# Patient Record
Sex: Male | Born: 1980 | Race: Black or African American | Hispanic: No | Marital: Single | Smoking: Current every day smoker
Health system: Southern US, Community
[De-identification: ages and names within clinical notes are randomized; demographics above are authoritative.]

## PROBLEM LIST (undated history)

## (undated) DIAGNOSIS — K6289 Other specified diseases of anus and rectum: Secondary | ICD-10-CM

## (undated) DIAGNOSIS — K602 Anal fissure, unspecified: Secondary | ICD-10-CM

## (undated) DIAGNOSIS — Z87828 Personal history of other (healed) physical injury and trauma: Secondary | ICD-10-CM

## (undated) HISTORY — PX: NO PAST SURGERIES: SHX2092

---
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
## 2001-02-20 ENCOUNTER — Emergency Department (HOSPITAL_COMMUNITY): Admission: EM | Admit: 2001-02-20 | Discharge: 2001-02-20 | Payer: Self-pay | Admitting: Emergency Medicine

## 2008-10-01 ENCOUNTER — Emergency Department (HOSPITAL_COMMUNITY): Admission: EM | Admit: 2008-10-01 | Discharge: 2008-10-02 | Payer: Self-pay | Admitting: Emergency Medicine

## 2009-01-03 ENCOUNTER — Emergency Department (HOSPITAL_COMMUNITY): Admission: EM | Admit: 2009-01-03 | Discharge: 2009-01-03 | Payer: Self-pay | Admitting: Emergency Medicine

## 2009-05-20 ENCOUNTER — Emergency Department (HOSPITAL_COMMUNITY): Admission: EM | Admit: 2009-05-20 | Discharge: 2009-05-20 | Payer: Self-pay | Admitting: Family Medicine

## 2009-05-30 ENCOUNTER — Emergency Department (HOSPITAL_COMMUNITY): Admission: AC | Admit: 2009-05-30 | Discharge: 2009-05-30 | Payer: Self-pay

## 2009-07-16 ENCOUNTER — Emergency Department (HOSPITAL_COMMUNITY): Admission: EM | Admit: 2009-07-16 | Discharge: 2009-07-16 | Payer: Self-pay | Admitting: Emergency Medicine

## 2009-08-26 ENCOUNTER — Emergency Department (HOSPITAL_COMMUNITY): Admission: EM | Admit: 2009-08-26 | Discharge: 2009-08-26 | Payer: Self-pay | Admitting: Family Medicine

## 2009-12-31 ENCOUNTER — Emergency Department (HOSPITAL_COMMUNITY): Admission: EM | Admit: 2009-12-31 | Discharge: 2009-12-31 | Payer: Self-pay | Admitting: Family Medicine

## 2011-03-09 LAB — GC/CHLAMYDIA PROBE AMP, GENITAL: GC Probe Amp, Genital: NEGATIVE

## 2011-03-09 LAB — RPR: RPR Ser Ql: NONREACTIVE

## 2011-03-10 LAB — GC/CHLAMYDIA PROBE AMP, GENITAL: Chlamydia, DNA Probe: NEGATIVE

## 2011-03-12 LAB — TYPE AND SCREEN: Antibody Screen: NEGATIVE

## 2011-03-12 LAB — GC/CHLAMYDIA PROBE AMP, GENITAL: Chlamydia, DNA Probe: POSITIVE — AB

## 2011-03-12 LAB — CBC
Hemoglobin: 14.8 g/dL (ref 13.0–17.0)
MCHC: 34.7 g/dL (ref 30.0–36.0)
Platelets: 315 10*3/uL (ref 150–400)
RBC: 4.49 MIL/uL (ref 4.22–5.81)

## 2011-03-12 LAB — LACTIC ACID, PLASMA: Lactic Acid, Venous: 2.5 mmol/L — ABNORMAL HIGH (ref 0.5–2.2)

## 2011-03-12 LAB — PROTIME-INR: Prothrombin Time: 12.6 seconds (ref 11.6–15.2)

## 2011-03-20 LAB — GC/CHLAMYDIA PROBE AMP, GENITAL
Chlamydia, DNA Probe: NEGATIVE
GC Probe Amp, Genital: POSITIVE — AB
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
## 2011-11-03 ENCOUNTER — Emergency Department (HOSPITAL_COMMUNITY)
Admission: EM | Admit: 2011-11-03 | Discharge: 2011-11-03 | Disposition: A | Discharge status: Home / Self Care | Payer: Self-pay | Source: Home / Self Care

## 2011-11-03 DIAGNOSIS — Z202 Contact with and (suspected) exposure to infections with a predominantly sexual mode of transmission: Secondary | ICD-10-CM

## 2011-11-03 MED ORDER — METRONIDAZOLE 500 MG PO TABS: 500.0000 mg | ORAL_TABLET | Freq: Two times a day (BID) | ORAL | Status: AC

## 2011-11-03 NOTE — ED Provider Notes (Signed)
History     CSN: [id number] Arrival date & time: 11/03/2011  9:08 PM   None     Chief Complaint  Patient presents with  . SEXUALLY TRANSMITTED DISEASE    Steven Irwin was told today he was exposed to trich and denies any symptoms    (Consider location/radiation/quality/duration/timing/severity/associated sxs/prior treatment) HPI Comments: Steven Irwin states he was told today that he was exposed to trichamonas by a recent sexual partner. He denies penile discharge, lesions, or dysuria. He is requesting STD testing and treatment.   The history is provided by the patient.    History reviewed. No pertinent past medical history.  History reviewed. No pertinent past surgical history.  History reviewed. No pertinent family history.  History  Substance Use Topics  . Smoking status: Current Everyday Smoker -- 1.0 packs/day    Types: Cigarettes  . Smokeless tobacco: Not on file  . Alcohol Use: Yes      Review of Systems  Gastrointestinal: Negative for abdominal pain.  Genitourinary: Negative for dysuria, frequency, discharge and genital sores.    Allergies  Review of patient's allergies indicates no known allergies.  Home Medications   Current Outpatient Rx  Name Route Sig Dispense Refill  . METRONIDAZOLE 500 MG PO TABS Oral Take 1 tablet (500 mg total) by mouth 2 (two) times daily. 14 tablet 0    BP 132/86  Pulse 93  Temp(Src) 98.4 F (36.9 C) (Oral)  Resp 17  SpO2 97%  Physical Exam  Nursing note and vitals reviewed. Constitutional: He is oriented to person, place, and time.  Cardiovascular: Normal rate, regular rhythm and normal heart sounds.   Pulmonary/Chest: Effort normal and breath sounds normal. No respiratory distress.  Genitourinary: Testes normal and penis normal. Circumcised.  Lymphadenopathy:       Right: No inguinal adenopathy present.       Left: No inguinal adenopathy present.  Neurological: He is alert and oriented to person, place, and time.  Skin: Skin is  warm and dry.  Psychiatric: He has a normal mood and affect.    ED Course  Procedures (including critical care time)   Labs Reviewed  GC/CHLAMYDIA PROBE AMP, GENITAL   No results found.   1. Exposure to STD       MDM          Melody Comas, PA 11/03/11 2151

## 2011-11-03 NOTE — ED Provider Notes (Signed)
Medical screening examination/treatment/procedure(s) were performed by non-physician practitioner and as supervising physician I was immediately available for consultation/collaboration.  LANEY,RONNIE   Ronnie Laney, MD 11/03/11 2223 

## 2011-11-05 LAB — GC/CHLAMYDIA PROBE AMP, GENITAL: Chlamydia, DNA Probe: NEGATIVE

## 2014-01-12 ENCOUNTER — Emergency Department (INDEPENDENT_AMBULATORY_CARE_PROVIDER_SITE_OTHER)
Admission: EM | Admit: 2014-01-12 | Discharge: 2014-01-12 | Disposition: A | Discharge status: Home / Self Care | Payer: Self-pay | Source: Home / Self Care | Attending: Family Medicine | Admitting: Family Medicine

## 2014-01-12 ENCOUNTER — Encounter (HOSPITAL_COMMUNITY): Payer: Self-pay | Admitting: Emergency Medicine

## 2014-01-12 DIAGNOSIS — Z113 Encounter for screening for infections with a predominantly sexual mode of transmission: Secondary | ICD-10-CM | POA: Insufficient documentation

## 2014-01-12 DIAGNOSIS — R369 Urethral discharge, unspecified: Secondary | ICD-10-CM

## 2014-01-12 DIAGNOSIS — Z202 Contact with and (suspected) exposure to infections with a predominantly sexual mode of transmission: Secondary | ICD-10-CM

## 2014-01-12 LAB — HIV ANTIBODY (ROUTINE TESTING W REFLEX): HIV: NONREACTIVE

## 2014-01-12 LAB — RPR: RPR: NONREACTIVE

## 2014-01-12 MED ORDER — AZITHROMYCIN 250 MG PO TABS
ORAL_TABLET | ORAL | Status: AC
  Filled 2014-01-12: qty 4

## 2014-01-12 MED ORDER — LIDOCAINE HCL (PF) 1 % IJ SOLN
INTRAMUSCULAR | Status: AC
  Filled 2014-01-12: qty 5

## 2014-01-12 MED ORDER — CEFTRIAXONE SODIUM 250 MG IJ SOLR
INTRAMUSCULAR | Status: AC
  Filled 2014-01-12: qty 250

## 2014-01-12 MED ORDER — AZITHROMYCIN 250 MG PO TABS
1000.0000 mg | ORAL_TABLET | Freq: Every day | ORAL | Status: DC
  Administered 2014-01-12: 1000 mg via ORAL

## 2014-01-12 MED ORDER — CEFTRIAXONE SODIUM 250 MG IJ SOLR
250.0000 mg | Freq: Once | INTRAMUSCULAR | Status: AC
  Administered 2014-01-12: 250 mg via INTRAMUSCULAR

## 2014-01-12 MED ORDER — METRONIDAZOLE 500 MG PO TABS: 1000.0000 mg | ORAL_TABLET | Freq: Two times a day (BID) | ORAL | Status: DC

## 2014-01-12 NOTE — ED Provider Notes (Signed)
CSN: [id number]     Arrival date & time 01/12/14  1241 History   First MD Initiated Contact with Patient 01/12/14 1345     Chief Complaint  Patient presents with  . Exposure to STD     (Consider location/radiation/quality/duration/timing/severity/associated sxs/prior Treatment) HPI Comments: 33 year old male presents complaining of STD exposure and penile discharge. He was told by a sexual partner that she has Trichomonas and Chlamydia and that he should be tested. He has noticed some very mild urethral irritation and very mild discharge. He denies testicular pain, abdominal pain, dysuria. He has no history of STDs. No penile lesions. No other systemic symptoms  Patient is a 33 y.o. male presenting with STD exposure.  Exposure to STD Pertinent negatives include no chest pain, no abdominal pain and no shortness of breath.    History reviewed. No pertinent past medical history. History reviewed. No pertinent past surgical history. History reviewed. No pertinent family history. History  Substance Use Topics  . Smoking status: Current Every Day Smoker -- 1.00 packs/day    Types: Cigarettes  . Smokeless tobacco: Not on file  . Alcohol Use: Yes    Review of Systems  Constitutional: Negative for fever, chills and fatigue.  HENT: Negative for sore throat.   Eyes: Negative for visual disturbance.  Respiratory: Negative for cough and shortness of breath.   Cardiovascular: Negative for chest pain, palpitations and leg swelling.  Gastrointestinal: Negative for nausea, vomiting, abdominal pain, diarrhea and constipation.  Genitourinary: Positive for discharge. Negative for dysuria, urgency, frequency, hematuria and genital sores.  Musculoskeletal: Negative for arthralgias, myalgias, neck pain and neck stiffness.  Skin: Negative for rash.  Neurological: Negative for dizziness, weakness and light-headedness.      Allergies  Review of patient's allergies indicates no known  allergies.  Home Medications   Current Outpatient Rx  Name  Route  Sig  Dispense  Refill  . metroNIDAZOLE (FLAGYL) 500 MG tablet   Oral   Take 2 tablets (1,000 mg total) by mouth 2 (two) times daily.   4 tablet   0    BP 128/87  Pulse 78  Temp(Src) 97.9 F (36.6 C) (Oral)  Resp 16  SpO2 99% Physical Exam  Nursing note and vitals reviewed. Constitutional: He is oriented to person, place, and time. He appears well-developed and well-nourished. No distress.  HENT:  Head: Normocephalic.  Pulmonary/Chest: Effort normal. No respiratory distress.  Abdominal: There is no tenderness.  Genitourinary: Testes normal and penis normal. Right testis shows no mass, no swelling and no tenderness. Left testis shows no mass, no swelling and no tenderness. No penile erythema or penile tenderness. No discharge found.  Lymphadenopathy:       Right: No inguinal adenopathy present.       Left: No inguinal adenopathy present.  Neurological: He is alert and oriented to person, place, and time. Coordination normal.  Skin: Skin is warm and dry. No rash noted. He is not diaphoretic.  Psychiatric: He has a normal mood and affect. Judgment normal.    ED Course  Procedures (including critical care time) Labs Review Labs Reviewed  RPR  HIV ANTIBODY (ROUTINE TESTING)  URINE CYTOLOGY ANCILLARY ONLY   Imaging Review No results found.    MDM   Final diagnoses:  Exposure to STD  Penile discharge    Treating for STDs given exposure. Urine cytology sent, HIV and RPR sent as well. Followup when necessary   Meds ordered this encounter  Medications  . cefTRIAXone (ROCEPHIN) injection  250 mg    Sig:   . azithromycin (ZITHROMAX) tablet 1,000 mg    Sig:   . metroNIDAZOLE (FLAGYL) 500 MG tablet    Sig: Take 2 tablets (1,000 mg total) by mouth 2 (two) times daily.    Dispense:  4 tablet    Refill:  0    Order Specific Question:  Supervising Provider    Answer:  Bradd CanaryKINDL, JAMES D [[id number]]        Steven GoodZachary H Greydis Stlouis, PA-C 01/12/14 [phone]

## 2014-01-12 NOTE — Discharge Instructions (Signed)
Sexually Transmitted Disease A sexually transmitted disease (STD) is a disease or infection that may be passed (transmitted) from person to person, usually during sexual activity. This may happen by way of saliva, semen, blood, vaginal mucus, or urine. Common STDs include:   Gonorrhea.   Chlamydia.   Syphilis.   HIV and AIDS.   Genital herpes.   Hepatitis B and C.   Trichomonas.   Human papillomavirus (HPV).   Pubic lice.   Scabies.  Mites.  Bacterial vaginosis. WHAT ARE CAUSES OF STDs? An STD may be caused by bacteria, a virus, or parasites. STDs are often transmitted during sexual activity if one person is infected. However, they may also be transmitted through nonsexual means. STDs may be transmitted after:   Sexual intercourse with an infected person.   Sharing sex toys with an infected person.   Sharing needles with an infected person or using unclean piercing or tattoo needles.  Having intimate contact with the genitals, mouth, or rectal areas of an infected person.   Exposure to infected fluids during birth. WHAT ARE THE SIGNS AND SYMPTOMS OF STDs? Different STDs have different symptoms. Some people may not have any symptoms. If symptoms are present, they may include:   Painful or bloody urination.   Pain in the pelvis, abdomen, vagina, anus, throat, or eyes.   Skin rash, itching, irritation, growths, sores (lesions), ulcerations, or warts in the genital or anal area.  Abnormal vaginal discharge with or without bad odor.   Penile discharge in men.   Fever.   Pain or bleeding during sexual intercourse.   Swollen glands in the groin area.   Yellow skin and eyes (jaundice). This is seen with hepatitis.   Swollen testicles.  Infertility.  Sores and blisters in the mouth. HOW ARE STDs DIAGNOSED? To make a diagnosis, your health care provider may:   Take a medical history.   Perform a physical exam.   Take a sample of any  discharge for examination.  Swab the throat, cervix, opening to the penis, rectum, or vagina for testing.  Test a sample of your first morning urine.   Perform blood tests.   Perform a Pap smear, if this applies.   Perform a colposcopy.   Perform a laparoscopy.  HOW ARE STDs TREATED? Treatment depends on the STD. Some STDs may be treated but not cured.   Chlamydia, gonorrhea, trichomonas, and syphilis can be cured with antibiotics.   Genital herpes, hepatitis, and HIV can be treated, but not cured, with prescribed medicines. The medicines lessen symptoms.   Genital warts from HPV can be treated with medicine or by freezing, burning (electrocautery), or surgery. Warts may come back.   HPV cannot be cured with medicine or surgery. However, abnormal areas may be removed from the cervix, vagina, or vulva.   If your diagnosis is confirmed, your recent sexual partners need treatment. This is true even if they are symptom-free or have a negative culture or evaluation. They should not have sex until their health care providers say it is OK. HOW CAN I REDUCE MY RISK OF GETTING AN STD?  Use latex condoms, dental dams, and water-soluble lubricants during sexual activity. Do not use petroleum jelly or oils.  Get vaccinated for HPV and hepatitis. If you have not received these vaccines in the past, talk to your health care provider about whether one or both might be right for you.   Avoid risky sex practices that can break the skin.  WHAT SHOULD   I DO IF I THINK I HAVE AN STD?  See your health care provider.   Inform all sexual partners. They should be tested and treated for any STDs.  Do not have sex until your health care provider says it is OK. WHEN SHOULD I GET HELP? Seek immediate medical care if:  You develop severe abdominal pain.  You are a man and notice swelling or pain in the testicles.  You are a woman and notice swelling or pain in your vagina. Document  Released: 02/09/2003 Document Revised: 09/09/2013 Document Reviewed: 06/09/2013 [employer] Patient Information 2014 [employer].  

## 2014-01-12 NOTE — ED Notes (Signed)
Reports having penile discharge for two days.  Pt was also informed by partner that she tested positive for chlamydia and trich.

## 2014-01-13 NOTE — ED Provider Notes (Signed)
Medical screening examination/treatment/procedure(s) were performed by resident physician or non-physician practitioner and as supervising physician I was immediately available for consultation/collaboration.   Lannah Koike DOUGLAS MD.   Raiden Haydu D Chong Wojdyla, MD 01/13/14 1950 

## 2014-01-14 ENCOUNTER — Other Ambulatory Visit (HOSPITAL_COMMUNITY)
Admission: RE | Admit: 2014-01-14 | Discharge: 2014-01-14 | Disposition: A | Discharge status: Home / Self Care | Payer: Self-pay | Source: Ambulatory Visit | Attending: Family Medicine | Admitting: Family Medicine

## 2014-01-14 NOTE — ED Notes (Signed)
GC and Trich neg., Chlamydia pos., HIV/RPR non-reactive.  Pt. Adequately treated with Zithromax and Rocephin. Will notify pt. Steven Irwin, [name] M 01/14/2014

## 2014-01-15 ENCOUNTER — Telehealth (HOSPITAL_COMMUNITY): Payer: Self-pay | Admitting: *Deleted
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
## 2014-01-15 NOTE — ED Notes (Addendum)
I called and left a message to call. Call 1. Steven Irwin, Steven Irwin 01/15/2014 Pt. called back. Pt. verified x 2 and given results.  Pt. Told he was adequately treated for Chlamydia with Zithromax.  Pt. instructed to notify his partner, no sex for 1 week and to practice safe sex. Pt. told [facility] by appointment in 6 mos. 01/15/2014

## 2014-01-17 NOTE — ED Notes (Signed)
2/12 DHHS [facility]. Cherly AndersonYork, Jamiee Milholland M

## 2014-06-28 ENCOUNTER — Emergency Department (HOSPITAL_COMMUNITY)
Admission: EM | Admit: 2014-06-28 | Discharge: 2014-06-28 | Disposition: A | Discharge status: Home / Self Care | Payer: Self-pay | Attending: Emergency Medicine | Admitting: Emergency Medicine

## 2014-06-28 ENCOUNTER — Emergency Department (HOSPITAL_COMMUNITY): Payer: Self-pay

## 2014-06-28 ENCOUNTER — Encounter (HOSPITAL_COMMUNITY): Payer: Self-pay | Admitting: Emergency Medicine

## 2014-06-28 ENCOUNTER — Emergency Department (HOSPITAL_COMMUNITY): Admission: EM | Admit: 2014-06-28 | Discharge: 2014-06-28 | Discharge status: Absent without leave | Payer: Self-pay

## 2014-06-28 DIAGNOSIS — F17200 Nicotine dependence, unspecified, uncomplicated: Secondary | ICD-10-CM | POA: Insufficient documentation

## 2014-06-28 DIAGNOSIS — Z23 Encounter for immunization: Secondary | ICD-10-CM | POA: Insufficient documentation

## 2014-06-28 DIAGNOSIS — Y929 Unspecified place or not applicable: Secondary | ICD-10-CM | POA: Insufficient documentation

## 2014-06-28 DIAGNOSIS — S3120XA Unspecified open wound of penis, initial encounter: Secondary | ICD-10-CM | POA: Insufficient documentation

## 2014-06-28 DIAGNOSIS — Y939 Activity, unspecified: Secondary | ICD-10-CM | POA: Insufficient documentation

## 2014-06-28 DIAGNOSIS — X58XXXA Exposure to other specified factors, initial encounter: Secondary | ICD-10-CM | POA: Insufficient documentation

## 2014-06-28 DIAGNOSIS — IMO0002 Reserved for concepts with insufficient information to code with codable children: Secondary | ICD-10-CM | POA: Insufficient documentation

## 2014-06-28 LAB — CBC WITH DIFFERENTIAL/PLATELET
BASOS ABS: 0 10*3/uL (ref 0.0–0.1)
Basophils Relative: 0 % (ref 0–1)
EOS ABS: 0.3 10*3/uL (ref 0.0–0.7)
EOS PCT: 3 % (ref 0–5)
HCT: 39.3 % (ref 39.0–52.0)
Hemoglobin: 13.6 g/dL (ref 13.0–17.0)
Lymphocytes Relative: 19 % (ref 12–46)
Lymphs Abs: 1.7 10*3/uL (ref 0.7–4.0)
MCH: 32.5 pg (ref 26.0–34.0)
MCHC: 34.6 g/dL (ref 30.0–36.0)
MCV: 94 fL (ref 78.0–100.0)
Monocytes Absolute: 1 10*3/uL (ref 0.1–1.0)
Monocytes Relative: 11 % (ref 3–12)
Neutro Abs: 5.9 10*3/uL (ref 1.7–7.7)
Neutrophils Relative %: 67 % (ref 43–77)
Platelets: 315 10*3/uL (ref 150–400)
RBC: 4.18 MIL/uL — ABNORMAL LOW (ref 4.22–5.81)
RDW: 12.2 % (ref 11.5–15.5)
WBC: 8.8 10*3/uL (ref 4.0–10.5)

## 2014-06-28 LAB — I-STAT CHEM 8, ED
BUN: 13 mg/dL (ref 6–23)
CREATININE: 1.2 mg/dL (ref 0.50–1.35)
Calcium, Ion: 1.2 mmol/L (ref 1.12–1.23)
Chloride: 101 mEq/L (ref 96–112)
Glucose, Bld: 124 mg/dL — ABNORMAL HIGH (ref 70–99)
HCT: 43 % (ref 39.0–52.0)
HEMOGLOBIN: 14.6 g/dL (ref 13.0–17.0)
Potassium: 3.6 mEq/L — ABNORMAL LOW (ref 3.7–5.3)
Sodium: 141 mEq/L (ref 137–147)
TCO2: 30 mmol/L (ref 0–100)

## 2014-06-28 LAB — URINALYSIS, ROUTINE W REFLEX MICROSCOPIC
Bilirubin Urine: NEGATIVE
Glucose, UA: NEGATIVE mg/dL
HGB URINE DIPSTICK: NEGATIVE
Ketones, ur: 15 mg/dL — AB
Leukocytes, UA: NEGATIVE
Nitrite: NEGATIVE
PROTEIN: NEGATIVE mg/dL
Specific Gravity, Urine: 1.005 (ref 1.005–1.030)
UROBILINOGEN UA: 0.2 mg/dL (ref 0.0–1.0)
pH: 6.5 (ref 5.0–8.0)

## 2014-06-28 MED ORDER — CEPHALEXIN 500 MG PO CAPS: 500.0000 mg | ORAL_CAPSULE | Freq: Four times a day (QID) | ORAL | Status: DC

## 2014-06-28 MED ORDER — IOHEXOL 300 MG/ML  SOLN
100.0000 mL | Freq: Once | INTRAMUSCULAR | Status: AC | PRN
  Administered 2014-06-28: 100 mL via INTRAVENOUS

## 2014-06-28 MED ORDER — HYDROCODONE-ACETAMINOPHEN 5-325 MG PO TABS: 1.0000 | ORAL_TABLET | ORAL | Status: DC | PRN

## 2014-06-28 MED ORDER — CEFAZOLIN SODIUM 1-5 GM-% IV SOLN
1.0000 g | Freq: Once | INTRAVENOUS | Status: AC
  Administered 2014-06-28: 1 g via INTRAVENOUS
  Filled 2014-06-28: qty 50

## 2014-06-28 MED ORDER — TETANUS-DIPHTH-ACELL PERTUSSIS 5-2.5-18.5 LF-MCG/0.5 IM SUSP
0.5000 mL | Freq: Once | INTRAMUSCULAR | Status: AC
  Administered 2014-06-28: 0.5 mL via INTRAMUSCULAR
  Filled 2014-06-28: qty 0.5

## 2014-06-28 MED ORDER — MORPHINE SULFATE 4 MG/ML IJ SOLN
4.0000 mg | Freq: Once | INTRAMUSCULAR | Status: AC
  Administered 2014-06-28: 4 mg via INTRAVENOUS
  Filled 2014-06-28: qty 1

## 2014-06-28 NOTE — ED Notes (Signed)
Pt to radiology at this time.
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
## 2014-06-28 NOTE — ED Notes (Signed)
Pt instructed to not have food/drink until scans are resulted. Verbalized understanding.
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
## 2014-06-28 NOTE — ED Notes (Signed)
Awaiting urinalysis results prior to pt being discharged.
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
## 2014-09-18 ENCOUNTER — Encounter (HOSPITAL_COMMUNITY): Payer: Self-pay | Admitting: Emergency Medicine

## 2014-09-18 ENCOUNTER — Emergency Department (HOSPITAL_COMMUNITY)
Admission: EM | Admit: 2014-09-18 | Discharge: 2014-09-18 | Disposition: A | Discharge status: Home / Self Care | Payer: Self-pay | Attending: Emergency Medicine | Admitting: Emergency Medicine

## 2014-09-18 DIAGNOSIS — S161XXA Strain of muscle, fascia and tendon at neck level, initial encounter: Secondary | ICD-10-CM | POA: Insufficient documentation

## 2014-09-18 DIAGNOSIS — Y9389 Activity, other specified: Secondary | ICD-10-CM | POA: Insufficient documentation

## 2014-09-18 DIAGNOSIS — S29001A Unspecified injury of muscle and tendon of front wall of thorax, initial encounter: Secondary | ICD-10-CM | POA: Insufficient documentation

## 2014-09-18 DIAGNOSIS — S0990XA Unspecified injury of head, initial encounter: Secondary | ICD-10-CM | POA: Insufficient documentation

## 2014-09-18 DIAGNOSIS — T148XXA Other injury of unspecified body region, initial encounter: Secondary | ICD-10-CM

## 2014-09-18 DIAGNOSIS — Y92410 Unspecified street and highway as the place of occurrence of the external cause: Secondary | ICD-10-CM | POA: Insufficient documentation

## 2014-09-18 DIAGNOSIS — Z720 Tobacco use: Secondary | ICD-10-CM | POA: Insufficient documentation

## 2014-09-18 DIAGNOSIS — R0789 Other chest pain: Secondary | ICD-10-CM

## 2014-09-18 DIAGNOSIS — S20309A Unspecified superficial injuries of unspecified front wall of thorax, initial encounter: Secondary | ICD-10-CM | POA: Insufficient documentation

## 2014-09-18 MED ORDER — CYCLOBENZAPRINE HCL 10 MG PO TABS: 10.0000 mg | ORAL_TABLET | Freq: Two times a day (BID) | ORAL | Status: DC | PRN

## 2014-09-18 MED ORDER — NAPROXEN 500 MG PO TABS: 500.0000 mg | ORAL_TABLET | Freq: Two times a day (BID) | ORAL | Status: DC
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
## 2014-09-23 NOTE — ED Provider Notes (Signed)
Medical screening examination/treatment/procedure(s) were performed by non-physician practitioner and as supervising physician I was immediately available for consultation/collaboration.   EKG Interpretation None       Arianie Couse L Shahd Occhipinti, MD 09/23/14 2057 

## 2015-05-23 ENCOUNTER — Emergency Department (HOSPITAL_COMMUNITY)
Admission: EM | Admit: 2015-05-23 | Discharge: 2015-05-23 | Disposition: A | Discharge status: Home / Self Care | Payer: Self-pay | Attending: Emergency Medicine | Admitting: Emergency Medicine

## 2015-05-23 ENCOUNTER — Encounter (HOSPITAL_COMMUNITY): Payer: Self-pay | Admitting: Family Medicine

## 2015-05-23 DIAGNOSIS — Z720 Tobacco use: Secondary | ICD-10-CM | POA: Insufficient documentation

## 2015-05-23 DIAGNOSIS — K625 Hemorrhage of anus and rectum: Secondary | ICD-10-CM | POA: Insufficient documentation

## 2015-05-23 LAB — CBC
HCT: 44.6 % (ref 39.0–52.0)
Hemoglobin: 15.8 g/dL (ref 13.0–17.0)
MCH: 32 pg (ref 26.0–34.0)
MCHC: 35.4 g/dL (ref 30.0–36.0)
MCV: 90.5 fL (ref 78.0–100.0)
PLATELETS: 327 10*3/uL (ref 150–400)
RBC: 4.93 MIL/uL (ref 4.22–5.81)
RDW: 12.8 % (ref 11.5–15.5)
WBC: 6.2 10*3/uL (ref 4.0–10.5)

## 2015-05-23 LAB — COMPREHENSIVE METABOLIC PANEL
ALBUMIN: 3.9 g/dL (ref 3.5–5.0)
ALK PHOS: 93 U/L (ref 38–126)
ALT: 31 U/L (ref 17–63)
AST: 29 U/L (ref 15–41)
Anion gap: 9 (ref 5–15)
BUN: 13 mg/dL (ref 6–20)
CO2: 26 mmol/L (ref 22–32)
Calcium: 9.7 mg/dL (ref 8.9–10.3)
Chloride: 104 mmol/L (ref 101–111)
Creatinine, Ser: 1.32 mg/dL — ABNORMAL HIGH (ref 0.61–1.24)
GFR calc Af Amer: >60 mL/min (ref >60)
Glucose, Bld: 108 mg/dL — ABNORMAL HIGH (ref 65–99)
POTASSIUM: 4.1 mmol/L (ref 3.5–5.1)
SODIUM: 139 mmol/L (ref 135–145)
Total Bilirubin: 0.9 mg/dL (ref 0.3–1.2)
Total Protein: 6.9 g/dL (ref 6.5–8.1)

## 2015-05-23 LAB — POC OCCULT BLOOD, ED: Fecal Occult Bld: POSITIVE — AB

## 2015-05-23 MED ORDER — DOCUSATE SODIUM 100 MG PO CAPS: 100.0000 mg | ORAL_CAPSULE | Freq: Two times a day (BID) | ORAL | Status: DC

## 2015-05-23 NOTE — ED Notes (Signed)
Pt here for rectal bleeding. sts x 3 days and blood in the toilet. Denies pain. sts bright red. Pt has a picture.

## 2015-05-23 NOTE — Discharge Instructions (Signed)
1. Medications: colace, usual home medications 2. Treatment: rest, drink plenty of fluids, increase the fiber in your diet 3. Follow Up: Please followup with the [facility] at your appointment on Wednesday and they will refer you to GI.     Bloody Stools Bloody stools often mean that there is a problem in the digestive tract. Your caregiver may use the term "melena" to describe black, tarry, and bad smelling stools or "hematochezia" to describe red or maroon-colored stools. Blood seen in the stool can be caused by bleeding anywhere along the intestinal tract.  A black stool usually means that blood is coming from the upper part of the gastrointestinal tract (esophagus, stomach, or small bowel). Passing maroon-colored stools or bright red blood usually means that blood is coming from lower down in the large bowel or the rectum. However, sometimes massive bleeding in the stomach or small intestine can cause bright red bloody stools.  Consuming black licorice, lead, iron pills, medicines containing bismuth subsalicylate, or blueberries can also cause black stools. Your caregiver can test black stools to see if blood is present. It is important that the cause of the bleeding be found. Treatment can then be started, and the problem can be corrected. Rectal bleeding may not be serious, but you should not assume everything is okay until you know the cause.It is very important to follow up with your caregiver or a specialist in gastrointestinal problems. CAUSES  Blood in the stools can come from various underlying causes.Often, the cause is not found during your first visit. Testing is often needed to discover the cause of bleeding in the gastrointestinal tract. Causes range from simple to serious or even life-threatening.Possible causes include:  Hemorrhoids.These are veins that are full of blood (engorged) in the rectum. They cause pain, inflammation, and may bleed.  Anal fissures.These are  areas of painful tearing which may bleed. They are often caused by passing hard stool.  Diverticulosis.These are pouches that form on the colon over time, with age, and may bleed significantly.  Diverticulitis.This is inflammation in areas with diverticulosis. It can cause pain, fever, and bloody stools, although bleeding is rare.  Proctitis and colitis. These are inflamed areas of the rectum or colon. They may cause pain, fever, and bloody stools.  Polyps and cancer. Colon cancer is a leading cause of preventable cancer death.It often starts out as precancerous polyps that can be removed during a colonoscopy, preventing progression into cancer. Sometimes, polyps and cancer may cause rectal bleeding.  Gastritis and ulcers.Bleeding from the upper gastrointestinal tract (near the stomach) may travel through the intestines and produce black, sometimes tarry, often bad smelling stools. In certain cases, if the bleeding is fast enough, the stools may not be black, but red and the condition may be life-threatening. SYMPTOMS  You may have stools that are bright red and bloody, that are normal color with blood on them, or that are dark black and tarry. In some cases, you may only have blood in the toilet bowl. Any of these cases need medical care. You may also have:  Pain at the anus or anywhere in the rectum.  Lightheadedness or feeling faint.  Extreme weakness.  Nausea or vomiting.  Fever. DIAGNOSIS Your caregiver may use the following methods to find the cause of your bleeding:  Taking a medical history. Age is important. Older people tend to develop polyps and cancer more often. If there is anal pain and a hard, large stool associated with bleeding, a tear  of the anus may be the cause. If blood drips into the toilet after a bowel movement, bleeding hemorrhoids may be the problem. The color and frequency of the bleeding are additional considerations. In most cases, the medical history  provides clues, but seldom the final answer.  A visual and finger (digital) exam. Your caregiver will inspect the anal area, looking for tears and hemorrhoids. A finger exam can provide information when there is tenderness or a growth inside. In men, the prostate is also examined.  Endoscopy. Several types of small, long scopes (endoscopes) are used to view the colon.  In the office, your caregiver may use a rigid, or more commonly, a flexible viewing sigmoidoscope. This exam is called flexible sigmoidoscopy. It is performed in 5 to 10 minutes.  A more thorough exam is accomplished with a colonoscope. It allows your caregiver to view the entire 5 to 6 foot long colon. Medicine to help you relax (sedative) is usually given for this exam. Frequently, a bleeding lesion may be present beyond the reach of the sigmoidoscope. So, a colonoscopy may be the best exam to start with. Both exams are usually done on an outpatient basis. This means the patient does not stay overnight in the hospital or surgery center.  An upper endoscopy may be needed to examine your stomach. Sedation is used and a flexible endoscope is put in your mouth, down to your stomach.  A barium enema X-ray. This is an X-ray exam. It uses liquid barium inserted by enema into the rectum. This test alone may not identify an actual bleeding point. X-rays highlight abnormal shadows, such as those made by lumps (tumors), diverticuli, or colitis. TREATMENT  Treatment depends on the cause of your bleeding.   For bleeding from the stomach or colon, the caregiver doing your endoscopy or colonoscopy may be able to stop the bleeding as part of the procedure.  Inflammation or infection of the colon can be treated with medicines.  Many rectal problems can be treated with creams, suppositories, or warm baths.  Surgery is sometimes needed.  Blood transfusions are sometimes needed if you have lost a lot of blood.  For any bleeding problem, let  your caregiver know if you take aspirin or other blood thinners regularly. HOME CARE INSTRUCTIONS   Take any medicines exactly as prescribed.  Keep your stools soft by eating a diet high in fiber. Prunes (1 to 3 a day) work well for many people.  Drink enough water and fluids to keep your urine clear or pale yellow.  Take sitz baths if advised. A sitz bath is when you sit in a bathtub with warm water for 10 to 15 minutes to soak, soothe, and cleanse the rectal area.  If enemas or suppositories are advised, be sure you know how to use them. Tell your caregiver if you have problems with this.  Monitor your bowel movements to look for signs of improvement or worsening. SEEK MEDICAL CARE IF:   You do not improve in the time expected.  Your condition worsens after initial improvement.  You develop any new symptoms. SEEK IMMEDIATE MEDICAL CARE IF:   You develop severe or prolonged rectal bleeding.  You vomit blood.  You feel weak or faint.  You have a fever. MAKE SURE YOU:  Understand these instructions.  Will watch your condition.  Will get help right away if you are not doing well or get worse. Document Released: 11/09/2002 Document Revised: 02/11/2012 Document Reviewed: 04/06/2011 ExitCare Patient Information  2015 [employer]. This information is not intended to replace advice given to you by your health care provider. Make sure you discuss any questions you have with your health care provider. ° °

## 2015-05-23 NOTE — ED Provider Notes (Signed)
CSN: [id number]     Arrival date & time 05/23/15  1852 History   First MD Initiated Contact with Patient 05/23/15 2112     Chief Complaint  Patient presents with  . Rectal Bleeding     (Consider location/radiation/quality/duration/timing/severity/associated sxs/prior Treatment) The history is provided by the patient and medical records. No language interpreter was used.     Steven Irwin is a 34 y.o. male  with no major medical Hx presents to the Emergency Department complaining of gradual, persistent, rectal bleeding onset 3 days ago.  Pt reports he awoke 3 days ago and had a bowel movement that was nothing but blood.  Pt reports that at times there is feces in the toilet with BRB and sometimes there is just blood.  Pt reports no significant clots with the blood.  He reports no black stools prior or during these episodes.  Pt denies all pain including rectal and abdominal.  Pt reports that at times the blood drips from his rectum while sitting on the toilet but never in his pants.  Pt denies previous colonoscopy and no family hx of colon cancer.  Pt reports normal appetite.  No blood thinners.  Pt reports no anal sex.  Pt denies fever, chills, headache, neck pain, chest pain, SOB, abd pain, N/V, weakness, dizziness, syncope, dysuria.   Patient denies regular use of NSAIDs. He also denies regular use of alcohol.   History reviewed. No pertinent past medical history. History reviewed. No pertinent past surgical history. History reviewed. No pertinent family history. History  Substance Use Topics  . Smoking status: Current Every Day Smoker -- 1.00 packs/day    Types: Cigarettes  . Smokeless tobacco: Not on file  . Alcohol Use: Yes    Review of Systems  Constitutional: Negative for fever, diaphoresis, appetite change, fatigue and unexpected weight change.  HENT: Negative for mouth sores.   Eyes: Negative for visual disturbance.  Respiratory: Negative for cough, chest tightness,  shortness of breath and wheezing.   Cardiovascular: Negative for chest pain.  Gastrointestinal: Positive for blood in stool and anal bleeding. Negative for nausea, vomiting, abdominal pain, diarrhea and constipation.  Endocrine: Negative for polydipsia, polyphagia and polyuria.  Genitourinary: Negative for dysuria, urgency, frequency and hematuria.  Musculoskeletal: Negative for back pain and neck stiffness.  Skin: Negative for rash.  Allergic/Immunologic: Negative for immunocompromised state.  Neurological: Negative for syncope, light-headedness and headaches.  Hematological: Does not bruise/bleed easily.  Psychiatric/Behavioral: Negative for sleep disturbance. The patient is not nervous/anxious.       Allergies  Review of patient's allergies indicates no known allergies.  Home Medications   Prior to Admission medications   Medication Sig Start Date End Date Taking? Authorizing Provider  cyclobenzaprine (FLEXERIL) 10 MG tablet Take 1 tablet (10 mg total) by mouth 2 (two) times daily as needed for muscle spasms. Patient not taking: Reported on 05/23/2015 09/18/14   Arthor Captain, PA-C  docusate sodium (COLACE) 100 MG capsule Take 1 capsule (100 mg total) by mouth every 12 (twelve) hours. 05/23/15   Demontae Antunes, PA-C  naproxen (NAPROSYN) 500 MG tablet Take 1 tablet (500 mg total) by mouth 2 (two) times daily with a meal. Patient not taking: Reported on 05/23/2015 09/18/14   Arthor Captain, PA-C   BP 118/71 mmHg  Pulse 65  Temp(Src) 98.5 F (36.9 C) (Oral)  Resp 19  Ht 6' (1.829 m)  Wt 210 lb 5 oz (95.397 kg)  BMI 28.52 kg/m2  SpO2 98% Physical Exam  Constitutional: He appears well-developed and well-nourished. No distress.  Awake, alert, nontoxic appearance  HENT:  Head: Normocephalic and atraumatic.  Mouth/Throat: Oropharynx is clear and moist. No oropharyngeal exudate.  Eyes: Conjunctivae are normal. No scleral icterus.  Neck: Normal range of motion. Neck supple.   Cardiovascular: Normal rate, regular rhythm, normal heart sounds and intact distal pulses.   No murmur heard. Pulmonary/Chest: Effort normal and breath sounds normal. No respiratory distress. He has no wheezes.  Equal chest expansion  Abdominal: Soft. Bowel sounds are normal. He exhibits no mass. There is no tenderness. There is no rebound and no guarding.  Genitourinary: Prostate normal. Rectal exam shows no external hemorrhoid, no internal hemorrhoid, no fissure, no mass, no tenderness and anal tone normal. Guaiac positive stool. Prostate is not enlarged and not tender.  Gross blood on rectal exam  Musculoskeletal: Normal range of motion. He exhibits no edema.  Neurological: He is alert.  Speech is clear and goal oriented Moves extremities without ataxia  Skin: Skin is warm and dry. He is not diaphoretic.  Psychiatric: He has a normal mood and affect.  Nursing note and vitals reviewed.   ED Course  Procedures (including critical care time) Labs Review Labs Reviewed  COMPREHENSIVE METABOLIC PANEL - Abnormal; Notable for the following:    Glucose, Bld 108 (*)    Creatinine, Ser 1.32 (*)    All other components within normal limits  POC OCCULT BLOOD, ED - Abnormal; Notable for the following:    Fecal Occult Bld POSITIVE (*)    All other components within normal limits  CBC    Imaging Review No results found.   EKG Interpretation None      MDM   Final diagnoses:  Rectal bleeding   Steven Irwin presents with painless rectal bleeding x 3 days.  Pt reports 3 episodes of BRBPR each day.  Hemoccult positive.  Labs reassuring; no anemia.  Pt without hypotension or tachycardia.  Abdomen is soft and nontender, nondistended.  Pt is not taking blood thinners.  Patient is well-appearing and stable. We have set up an appointment with the [facility] in 2 days for follow-up and referral to gastroenterology. Strict return precautions given including increased bleeding,  development of pain or other concerns.  Will d/c with colace to treat for a possible anal fissure.    BP 118/71 mmHg  Pulse 65  Temp(Src) 98.5 F (36.9 C) (Oral)  Resp 19  Ht 6' (1.829 m)  Wt 210 lb 5 oz (95.397 kg)  BMI 28.52 kg/m2  SpO2 98%  The patient was discussed with Dr. Freida Busman who agrees with the treatment plan.   Dahlia Client Cabot Cromartie, PA-C 05/23/15 2356  Lorre Nick, MD 05/30/15 [phone]

## 2015-05-23 NOTE — Care Management Note (Signed)
Case Management Note  Patient Details  Name: Steven Irwin MRN: [id number] Date of Birth: [birth year]  Subjective/Objective:                  Rectal Bleeding x3 days  Action/Plan: Assist with establishing care with a PCP    Expected Discharge Date: 6/20/ 16                Expected Discharge Plan:   Establishing care with [facility]  In-House Referral:     Discharge planning Services   PCP,  Post Acute Care Choice:    Choice offered to:     DME Arranged:    DME Agency:     HH Arranged:    [place] Agency:     Status of Service:   Completed  Medicare Important Message Given:    Date Medicare IM Given:    Medicare IM give by:    Date Additional Medicare IM Given:    Additional Medicare Important Message give by:     If discussed at [place] of Stay Meetings, dates discussed:    Additional Comments: CM met with patient at bedside regarding assisting with follow up care. Patient states, he no longer has health care coverage. Discussed the [facility] patient agreeable. Appt scheduled for Wednesday June 22 at 2:30 pm., patient verbalizes understanding provided patient with [name] contact information. Updated H. Mutherbaugh PA-C, no further ED CM needs identified.  Laurena Slimmer, RN 05/23/2015, 10:56 PM

## 2015-05-25 ENCOUNTER — Ambulatory Visit: Payer: No Typology Code available for payment source | Attending: Family Medicine | Admitting: Family Medicine

## 2015-05-25 ENCOUNTER — Encounter: Payer: Self-pay | Admitting: Family Medicine

## 2015-05-25 VITALS — BP 121/81 | HR 75 | Temp 98.2°F | Ht 72.0 in | Wt 215.0 lb

## 2015-05-25 DIAGNOSIS — K922 Gastrointestinal hemorrhage, unspecified: Secondary | ICD-10-CM | POA: Insufficient documentation

## 2015-05-25 DIAGNOSIS — F17210 Nicotine dependence, cigarettes, uncomplicated: Secondary | ICD-10-CM | POA: Insufficient documentation

## 2015-05-25 NOTE — Progress Notes (Signed)
Subjective:    Patient ID: Steven Irwin, male    DOB: 04-11-1981, 34 y.o.   MRN: [id number]  HPI  Steven Irwin had presented to the ED with rectal bleed. He had labs which revealed a stable Hemoglobin of 15.8. He was placed on Colace to treat possible anal fissure and then discharged with recommendation to follow up in the outpatient clinic and a referral to GI.   Today he complains he has had bright red blood par rectum for 5 days and has had no constipation and does not strain. Sometimes he does not even have stool in the toilet bowel and just sees blood.  Denies being on a blood thinner, has no history of bleeding disorders neither does he have a family history. Denies feeling lightheaded  History reviewed. No pertinent past medical history.  History reviewed. No pertinent past surgical history.  History reviewed. No pertinent family history.  History   Social History  . Marital Status: Single    Spouse Name: N/A  . Number of Children: N/A  . Years of Education: N/A   Occupational History  . Not on file.   Social History Main Topics  . Smoking status: Current Every Day Smoker -- 1.00 packs/day    Types: Cigarettes  . Smokeless tobacco: Not on file  . Alcohol Use: Yes     Comment: patient drinks liquor on weekend when he goes out  . Drug Use: No  . Sexual Activity: Yes   Other Topics Concern  . Not on file   Social History Narrative    No Known Allergies  No current outpatient prescriptions on file prior to visit.   No current facility-administered medications on file prior to visit.     Review of Systems  Constitutional: Negative for activity change and appetite change.  HENT: Negative for sinus pressure and sore throat.   Eyes: Negative for visual disturbance.  Respiratory: Negative for chest tightness and shortness of breath.   Cardiovascular: Negative for chest pain and palpitations.  Gastrointestinal:       See hpi  Endocrine: Negative  for cold intolerance, heat intolerance and polyphagia.  Genitourinary: Negative for dysuria, frequency and difficulty urinating.  Musculoskeletal: Negative for back pain, joint swelling and arthralgias.  Skin: Negative for color change.  Neurological: Negative for dizziness, tremors and weakness.  Psychiatric/Behavioral: Negative for suicidal ideas and behavioral problems.        Objective: Filed Vitals:   05/25/15 1512  BP: 121/81  Pulse: 75  Temp: 98.2 F (36.8 C)  Height: 6' (1.829 m)  Weight: 215 lb (97.523 kg)  SpO2: 98%      Physical Exam  Constitutional: He is oriented to person, place, and time. He appears well-developed and well-nourished.  HENT:  Head: Normocephalic and atraumatic.  Right Ear: External ear normal.  Left Ear: External ear normal.  Eyes: Conjunctivae and EOM are normal. Pupils are equal, round, and reactive to light.  Neck: Normal range of motion. Neck supple. No tracheal deviation present.  Cardiovascular: Normal rate, regular rhythm and normal heart sounds.   No murmur heard. Pulmonary/Chest: Effort normal and breath sounds normal. No respiratory distress. He has no wheezes. He exhibits no tenderness.  Abdominal: Soft. Bowel sounds are normal. He exhibits no mass. There is no tenderness.  Genitourinary: Rectum normal.  No hemorrhoids  Musculoskeletal: Normal range of motion. He exhibits no edema or tenderness.  Neurological: He is alert and oriented to person, place, and time.  Skin: Skin  is warm and dry.  Psychiatric: He has a normal mood and affect.    CBC    Component Value Date/Time   WBC 6.2 05/23/2015 1904   RBC 4.93 05/23/2015 1904   HGB 15.8 05/23/2015 1904   HCT 44.6 05/23/2015 1904   PLT 327 05/23/2015 1904   MCV 90.5 05/23/2015 1904   MCH 32.0 05/23/2015 1904   MCHC 35.4 05/23/2015 1904   RDW 12.8 05/23/2015 1904   LYMPHSABS 1.7 06/28/2014 1800   MONOABS 1.0 06/28/2014 1800   EOSABS 0.3 06/28/2014 1800   BASOSABS 0.0  06/28/2014 1800         Assessment & Plan:  34 year old male with complaints of bright red blood par rectum of unknown etiology.  Lower GI bleed: Will send off PT/PTT to evaluate for bleeding disorder Meanwhile referred to GI for endoscopy Will need a repeat CBC  At his next visit.

## 2015-05-25 NOTE — Patient Instructions (Signed)
Rectal Bleeding °Rectal bleeding is when blood passes out of the anus. It is usually a sign that something is wrong. It may not be serious, but it should always be evaluated. Rectal bleeding may present as bright red blood or extremely dark stools. The color may range from dark red or maroon to black (like tar). It is important that the cause of rectal bleeding be identified so treatment can be started and the problem corrected. °CAUSES  °· Hemorrhoids. These are enlarged (dilated) blood vessels or veins in the anal or rectal area. °· Fistulas. These are abnormal, burrowing channels that usually run from inside the rectum to the skin around the anus. They can bleed. °· Anal fissures. This is a tear in the tissue of the anus. Bleeding occurs with bowel movements. °· Diverticulosis. This is a condition in which pockets or sacs project from the bowel wall. Occasionally, the sacs can bleed. °· Diverticulitis. This is an infection involving diverticulosis of the colon. °· Proctitis and colitis. These are conditions in which the rectum, colon, or both, can become inflamed and pitted (ulcerated). °· Polyps and cancer. Polyps are non-cancerous (benign) growths in the colon that may bleed. Certain types of polyps turn into cancer. °· Protrusion of the rectum. Part of the rectum can project from the anus and bleed. °· Certain medicines. °· Intestinal infections. °· Blood vessel abnormalities. °HOME CARE INSTRUCTIONS °· Eat a high-fiber diet to keep your stool soft. °· Limit activity. °· Drink enough fluids to keep your urine clear or pale yellow. °· Warm baths may be useful to soothe rectal pain. °· Follow up with your caregiver as directed. °SEEK IMMEDIATE MEDICAL CARE IF: °· You develop increased bleeding. °· You have black or dark red stools. °· You vomit blood or material that looks like coffee grounds. °· You have abdominal pain or tenderness. °· You have a fever. °· You feel weak, nauseous, or you faint. °· You have  severe rectal pain or you are unable to have a bowel movement. °MAKE SURE YOU: °· Understand these instructions. °· Will watch your condition. °· Will get help right away if you are not doing well or get worse. °Document Released: 05/11/2002 Document Revised: 02/11/2012 Document Reviewed: 05/06/2011 °ExitCare® Patient Information ©2015 ExitCare, LLC. This information is not intended to replace advice given to you by your health care provider. Make sure you discuss any questions you have with your health care provider. ° °

## 2015-05-25 NOTE — Progress Notes (Signed)
Patient here to establish care Patient has no known medical problems and takes no medication Patient recently in ED for rectal bleeding of unknown origin hgb 15.8 on 6/20 Patient reports still bright red blood coming from rectum-no pain Patient needs to apply for [employer] discount

## 2015-05-26 ENCOUNTER — Telehealth: Payer: Self-pay | Admitting: *Deleted

## 2015-05-26 LAB — PROTIME-INR
INR: 0.85 (ref <1.50)
Prothrombin Time: 11.6 seconds (ref 11.6–15.2)

## 2015-05-26 LAB — APTT: aPTT: 31 seconds (ref 24–37)

## 2015-05-26 NOTE — Telephone Encounter (Signed)
-----   Message from Jaclyn Shaggy, MD sent at 05/26/2015  9:11 AM EDT ----- Please inform the patient that labs are normal. Thank you.

## 2015-05-26 NOTE — Telephone Encounter (Signed)
Gave results to patient. Patient verbalized understanding.

## 2015-05-30 ENCOUNTER — Inpatient Hospital Stay: Payer: Self-pay | Admitting: Family Medicine

## 2015-06-14 ENCOUNTER — Ambulatory Visit: Payer: Self-pay | Admitting: Family Medicine

## 2016-05-23 ENCOUNTER — Encounter (HOSPITAL_COMMUNITY): Payer: Self-pay | Admitting: Emergency Medicine

## 2016-05-23 ENCOUNTER — Emergency Department (HOSPITAL_COMMUNITY)
Admission: EM | Admit: 2016-05-23 | Discharge: 2016-05-23 | Disposition: A | Discharge status: Home / Self Care | Payer: No Typology Code available for payment source | Attending: Emergency Medicine | Admitting: Emergency Medicine

## 2016-05-23 DIAGNOSIS — K047 Periapical abscess without sinus: Secondary | ICD-10-CM | POA: Insufficient documentation

## 2016-05-23 DIAGNOSIS — F17210 Nicotine dependence, cigarettes, uncomplicated: Secondary | ICD-10-CM | POA: Insufficient documentation

## 2016-05-23 MED ORDER — PENICILLIN V POTASSIUM 500 MG PO TABS: 500.0000 mg | ORAL_TABLET | Freq: Four times a day (QID) | ORAL | Status: AC

## 2016-05-23 MED ORDER — HYDROCODONE-ACETAMINOPHEN 5-325 MG PO TABS: 1.0000 | ORAL_TABLET | Freq: Four times a day (QID) | ORAL | Status: DC | PRN

## 2016-05-23 NOTE — ED Notes (Signed)
See PA note for full assessment.  

## 2016-05-23 NOTE — Discharge Instructions (Signed)
Take penicillin as prescribed until all gone. Take Norco for severe pain. Warm compresses. Try salt water swishes. Follow up with a dentist. See the list of dentist below. Return if worsening.    Dental Abscess A dental abscess is a collection of pus in or around a tooth. CAUSES This condition is caused by a bacterial infection around the root of the tooth that involves the inner part of the tooth (pulp). It may result from:  Severe tooth decay.  Trauma to the tooth that allows bacteria to enter into the pulp, such as a broken or chipped tooth.  Severe gum disease around a tooth. SYMPTOMS Symptoms of this condition include:  Severe pain in and around the infected tooth.  Swelling and redness around the infected tooth, in the mouth, or in the face.  Tenderness.  Pus drainage.  Bad breath.  Bitter taste in the mouth.  Difficulty swallowing.  Difficulty opening the mouth.  Nausea.  Vomiting.  Chills.  Swollen neck glands.  Fever. DIAGNOSIS This condition is diagnosed with examination of the infected tooth. During the exam, your dentist may tap on the infected tooth. Your dentist will also ask about your medical and dental history and may order X-rays. TREATMENT This condition is treated by eliminating the infection. This may be done with:  Antibiotic medicine.  A root canal. This may be performed to save the tooth.  Pulling (extracting) the tooth. This may also involve draining the abscess. This is done if the tooth cannot be saved. HOME CARE INSTRUCTIONS  Take medicines only as directed by your dentist.  If you were prescribed antibiotic medicine, finish all of it even if you start to feel better.  Rinse your mouth (gargle) often with salt water to relieve pain or swelling.  Do not drive or operate heavy machinery while taking pain medicine.  Do not apply heat to the outside of your mouth.  Keep all follow-up visits as directed by your dentist. This is  important. SEEK MEDICAL CARE IF:  Your pain is worse and is not helped by medicine. SEEK IMMEDIATE MEDICAL CARE IF:  You have a fever or chills.  Your symptoms suddenly get worse.  You have a very bad headache.  You have problems breathing or swallowing.  You have trouble opening your mouth.  You have swelling in your neck or around your eye.   This information is not intended to replace advice given to you by your health care provider. Make sure you discuss any questions you have with your health care provider.   Document Released: 11/19/2005 Document Revised: 04/05/2015 Document Reviewed: 11/16/2014 Elsevier Interactive Patient Education 2016 [employer]   [employer] Resource Guide Dental The United [employer] 211 is a great source of information about community services available.  Access by dialing 2-1-1 from anywhere in [place], or by website -  PooledIncome.plwww.nc211.org.   Other Local Resources (Updated 12/2015)  Dental  Care   Services    Phone Number and Address  Cost  [place] [facility] For children 650 - 521 years of age:   Cleaning  Tooth brushing/flossing instruction  Sealants, fillings, crowns  Extractions  Emergency treatment  [phone] [address] [place], [place] [phone] Charges based on family income.  Medicaid and some insurance plans accepted.     [facility]  Cleaning  Sealants, fillings, crowns  Extractions  Emergency treatment [phone] [address] [place], [place]  Pregnant women 18 years  of age or older with a Medicaid card  [facility] [place]  Cleaning  Sealants, fillings, crowns  Extractions  Emergency treatment [phone] [address] [place], [place] Pregnant women 57 years of age or older with a Medicaid card  [facility] - [facility] For children 0 - 21 years  of age:   Cleaning  Tooth brushing/flossing instruction  Sealants, fillings, crowns  Extractions  Emergency treatment Limited orthodontic services for patients with Medicaid [phone] [address] [place], [place] [address] Medicaid and [facility] Health Choice cover for children up to age 80 and pregnant women.  Parents of children up to age 2 without Medicaid pay a reduced fee at time of service.  [facility] of [employer] For children 75 - 92 years of age:   Cleaning  Tooth brushing/flossing instruction  Sealants, fillings, crowns  Extractions  Emergency treatment Limited orthodontic services for patients with Medicaid [phone] [address] [place], [place].  Medicaid and [place] Health Choice cover for children up to age 55 and pregnant women.  Parents of children up to age 36 without Medicaid pay a reduced fee.  [facility]  Sealants, fillings, crowns  Extractions  Hours: Tuesdays and Thursdays, 4:15 - 8 pm [phone] [address] [place], [place] [address] Services free of charge to [facility] residents ages 18-64 who do not have health insurance, Medicare, [place], or [place] benefits and fall within federal poverty guidelines  [employer]    Provides dental care in addition to primary medical care, nutritional counseling, and pharmacy:  Nurse, mental health, fillings, crowns  Extractions                  [phone] [facility], [address] [place], [place]  [phone] [facility], [address] [place], [place]  [phone] [facility] [place], [place]  [phone] [facility], [address] [place], [place]  [phone] [facility] [address] [place], [place] Accepts [place], [place], most  insurance.  Also provides services available to all with fees adjusted based on ability to pay.    [facility]  Cleaning  Tooth brushing/flossing instruction  Sealants, fillings, crowns  Extractions  Emergency treatment Hours: Tuesdays, Thursdays, and Fridays from 8 am to 5 pm by appointment only. [phone] [address] [place], [place] [address] [facility] residents with Medicaid (depending on eligibility) and children with [facility] Health Choice - call for more information.  [facility]  Extractions only  Hours: 2nd and 4th Thursday of each month from 6:30 am - 9 am.   [phone]. [phone] [place], [place] [address] Ages 77 and older only.  Patients are seen on a first come, first served basis.  [employer]  [employer]  Extractions  Orthodontics  Endodontics  Implants/Crowns/Bridges  Complete and partial dentures [phone] [place],  Patients must complete an application for services.  There is often a waiting list.

## 2016-05-23 NOTE — ED Provider Notes (Signed)
CSN: [id number]     Arrival date & time 05/23/16  1351 History  By signing my name below, I, Rosario AdieWilliam Andrew Hiatt, attest that this documentation has been prepared under the direction and in the presence of Maysoon Lozada, PA-C.   Electronically Signed: Rosario AdieWilliam Andrew Hiatt, ED Scribe. 05/23/2016. 3:27 PM.   Chief Complaint  Patient presents with  . Oral Swelling   The history is provided by the patient. No language interpreter was used.   HPI Comments: Steven EpsteinChristopher R Irwin is a 35 y.o. male who presents to the Emergency Department complaining of gradual onset, gradually worsening, constant, aching, 9/10 right-sided upper dental pain onset 1 week ago. Pt has associated right sided facial swelling that began one day ago secondary to his pain. Pt has been taking Motrin with no relief of his pain or swelling. His pain is aggravated with chewing. Pt reports that he noticed a piece of a tooth fall out a few days ago. He is not currently followed by a dentist. He is not complaining of any other symptoms at this time.   History reviewed. No pertinent past medical history. History reviewed. No pertinent past surgical history. No family history on file. Social History  Substance Use Topics  . Smoking status: Current Every Day Smoker -- 1.00 packs/day    Types: Cigarettes  . Smokeless tobacco: None  . Alcohol Use: Yes     Comment: patient drinks liquor on weekend when he goes out    Review of Systems  Constitutional: Negative for fever.  HENT: Positive for dental problem and facial swelling.    Allergies  Review of patient's allergies indicates no known allergies.  Home Medications   Prior to Admission medications   Not on File   BP 132/92 mmHg  Pulse 84  Temp(Src) 97.9 F (36.6 C) (Oral)  Resp 16  Ht 6\' 2"  (1.88 m)  Wt 215 lb (97.523 kg)  BMI 27.59 kg/m2  SpO2 100%   Physical Exam  Constitutional: He is oriented to person, place, and time. He appears well-developed and  well-nourished.  HENT:  Head: Normocephalic.  Poor dentition. Right upper first molar decayed, with surrounding gum swelling. Mild trismus noted, patient is able to open his mouth 3 fingers width wide. No swelling under the tongue. No submandibular swelling or tenderness. Mild right facial swelling noted.  Eyes: Conjunctivae are normal.  Neck: Neck supple.  Cardiovascular: Normal rate.   Pulmonary/Chest: Effort normal. No respiratory distress.  Abdominal: He exhibits no distension.  Musculoskeletal: Normal range of motion.  Neurological: He is alert and oriented to person, place, and time.  Skin: Skin is warm and dry.  Psychiatric: He has a normal mood and affect. His behavior is normal.  Nursing note and vitals reviewed.  ED Course  Procedures (including critical care time)  DIAGNOSTIC STUDIES: Oxygen Saturation is 100% on RA, normal by my interpretation.   COORDINATION OF CARE: 3:26 PM-Discussed next steps with pt including antibiotics. Pt verbalized understanding and is agreeable with the plan.   MDM   Final diagnoses:  Dental abscess   Patient emergency department with a dental abscess. He has no evidence of Ludwig's angina at this time. Will treat with penicillin, ibuprofen, Norco for severe pain. Follow-up with a dentist. Resource guide provided. Return precautions discussed.  Filed Vitals:   05/23/16 1411  BP: 132/92  Pulse: 84  Temp: 97.9 F (36.6 C)  TempSrc: Oral  Resp: 16  Height: 6\' 2"  (1.88 m)  Weight: 97.523 kg  SpO2: 100%    I personally performed the services described in this documentation, which was scribed in my presence. The recorded information has been reviewed and is accurate.   Jaynie Crumble, PA-C 05/23/16 1536  Rolland Porter, MD 05/31/16 [phone]

## 2016-05-23 NOTE — ED Notes (Signed)
Pt reports toothache for 1 week with facial swelling times two days on right side.

## 2016-09-12 ENCOUNTER — Emergency Department (HOSPITAL_COMMUNITY)
Admission: EM | Admit: 2016-09-12 | Discharge: 2016-09-12 | Disposition: A | Discharge status: Home / Self Care | Payer: Medicaid Other | Attending: Emergency Medicine | Admitting: Emergency Medicine

## 2016-09-12 ENCOUNTER — Encounter (HOSPITAL_COMMUNITY): Payer: Self-pay | Admitting: *Deleted

## 2016-09-12 DIAGNOSIS — K625 Hemorrhage of anus and rectum: Secondary | ICD-10-CM | POA: Diagnosis present

## 2016-09-12 DIAGNOSIS — F17210 Nicotine dependence, cigarettes, uncomplicated: Secondary | ICD-10-CM | POA: Diagnosis not present

## 2016-09-12 DIAGNOSIS — K649 Unspecified hemorrhoids: Secondary | ICD-10-CM | POA: Diagnosis not present

## 2016-09-12 MED ORDER — HYDROCORTISONE 2.5 % RE CREA: 1.0000 "application " | TOPICAL_CREAM | Freq: Two times a day (BID) | RECTAL | 0 refills | Status: DC

## 2016-09-12 NOTE — Discharge Instructions (Signed)
Try warm showerhead after BM and a couple times a day.

## 2016-09-12 NOTE — ED Triage Notes (Addendum)
Pt complains of rectal pain and bleeding for the past 5 months. Pt states rectal pain is constant. Pt is unsure if he has hemorrhoids. Pt states he has tried preparation h and warm bath, which provided temporary relief.

## 2016-09-12 NOTE — ED Notes (Signed)
Patient verbalizes understanding of discharge instructions, prescriptions, home care and follow up care. Patient out of department at this time. 

## 2016-09-12 NOTE — ED Provider Notes (Signed)
[facility]-EMERGENCY DEPT Provider Note   CSN: [id number] Arrival date & time: 09/12/16  1824     History   Chief Complaint Chief Complaint  Patient presents with  . Rectal Bleeding    HPI Steven Irwin is a 35 y.o. male.  35 yo M with a chief complaint of rectal bleeding burning and itching. Going on for the past 5 months. She was seen here started and stopped so he did not follow-up. However he has been having these symptoms off and on. Has tried hemorrhoid wipes without relief. Denies chest pain weakness near syncope shortness of breath.   The history is provided by the patient.  Rectal Bleeding  Quality:  Bright red Amount:  Moderate Duration:  5 months Timing:  Intermittent Chronicity:  New Context: defecation and spontaneously   Context: not anal fissures   Similar prior episodes: yes   Relieved by:  Wipes Worsened by:  Nothing Ineffective treatments:  None tried Associated symptoms: no abdominal pain, no fever and no vomiting     History reviewed. No pertinent past medical history.  Patient Active Problem List   Diagnosis Date Noted  . Lower gastrointestinal bleed 05/25/2015    History reviewed. No pertinent surgical history.     Home Medications    Prior to Admission medications   Medication Sig Start Date End Date Taking? Authorizing Provider  HYDROcodone-acetaminophen (NORCO) 5-325 MG tablet Take 1 tablet by mouth every 6 (six) hours as needed for moderate pain. 05/23/16   Tatyana Kirichenko, PA-C  hydrocortisone (ANUSOL-HC) 2.5 % rectal cream Place 1 application rectally 2 (two) times daily. 09/12/16   Melene Planan Oneal Biglow, DO    Family History No family history on file.  Social History Social History  Substance Use Topics  . Smoking status: Current Every Day Smoker    Packs/day: 1.00    Types: Cigarettes  . Smokeless tobacco: Never Used  . Alcohol use Yes     Comment: patient drinks liquor on weekend when he goes out     Allergies   Review of  patient's allergies indicates no known allergies.   Review of Systems Review of Systems  Constitutional: Negative for chills and fever.  HENT: Negative for congestion and facial swelling.   Eyes: Negative for discharge and visual disturbance.  Respiratory: Negative for shortness of breath.   Cardiovascular: Negative for chest pain and palpitations.  Gastrointestinal: Positive for blood in stool, hematochezia and rectal pain. Negative for abdominal pain, diarrhea and vomiting.  Musculoskeletal: Negative for arthralgias and myalgias.  Skin: Negative for color change and rash.  Neurological: Negative for tremors, syncope and headaches.  Psychiatric/Behavioral: Negative for confusion and dysphoric mood.     Physical Exam Updated Vital Signs BP 136/80 (BP Location: Left Arm)   Pulse 90   Temp 98.5 F (36.9 C) (Oral)   Resp 18   Ht 6\' 1"  (1.854 m)   Wt 218 lb 9.6 oz (99.2 kg)   SpO2 100%   BMI 28.84 kg/m   Physical Exam  Constitutional: He is oriented to person, place, and time. He appears well-developed and well-nourished.  HENT:  Head: Normocephalic and atraumatic.  Eyes: EOM are normal. Pupils are equal, round, and reactive to light.  Neck: Normal range of motion. Neck supple. No JVD present.  Cardiovascular: Normal rate and regular rhythm.  Exam reveals no gallop and no friction rub.   No murmur heard. Pulmonary/Chest: No respiratory distress. He has no wheezes.  Abdominal: He exhibits no distension and no  mass. There is no tenderness. There is no rebound and no guarding.  Genitourinary: Rectal exam shows mass and tenderness.     Musculoskeletal: Normal range of motion.  Neurological: He is alert and oriented to person, place, and time.  Skin: No rash noted. No pallor.  Psychiatric: He has a normal mood and affect. His behavior is normal.  Nursing note and vitals reviewed.    ED Treatments / Results  Labs (all labs ordered are listed, but only abnormal results are  displayed) Labs Reviewed - No data to display  EKG  EKG Interpretation None       Radiology No results found.  Procedures Procedures (including critical care time)  Medications Ordered in ED Medications - No data to display   Initial Impression / Assessment and Plan / ED Course  I have reviewed the triage vital signs and the nursing notes.  Pertinent labs & imaging results that were available during my care of the patient were reviewed by me and considered in my medical decision making (see chart for details).  Clinical Course    35 yo M With chief complaints of rectal bleeding. Patient has a tender mass just inside the rectum. I suspect that this is a hemorrhoid. There is no noted bleeding. Will start hemorrhoid cream.  Given surgical follow up if symptoms persist.   11:17 PM:  I have discussed the diagnosis/risks/treatment options with the patient and believe the pt to be eligible for discharge home to follow-up with PCP, gen surgery. We also discussed returning to the ED immediately if new or worsening sx occur. We discussed the sx which are most concerning (e.g., sudden worsening pain, fever, inability to tolerate by mouth) that necessitate immediate return. Medications administered to the patient during their visit and any new prescriptions provided to the patient are listed below.  Medications given during this visit Medications - No data to display   The patient appears reasonably screen and/or stabilized for discharge and I doubt any other medical condition or other [facility] requiring further screening, evaluation, or treatment in the ED at this time prior to discharge.    Final Clinical Impressions(s) / ED Diagnoses   Final diagnoses:  Rectal bleeding  Hemorrhoids, unspecified hemorrhoid type    New Prescriptions New Prescriptions   HYDROCORTISONE (ANUSOL-HC) 2.5 % RECTAL CREAM    Place 1 application rectally 2 (two) times daily.     Melene Plan, DO 09/12/16  2317

## 2016-10-29 ENCOUNTER — Other Ambulatory Visit: Payer: Self-pay | Admitting: General Surgery

## 2016-10-29 NOTE — H&P (Signed)
  History of Present Illness Steven Irwin(Steffi Noviello MD; 10/29/2016 12:23 PM) The patient is a 35 year old male who presents with anal pain. 35 year old male who presents to the office for evaluation of rectal bleeding. He has been seen by Dr. Doylene Canardonner in our office and was felt to have an anal fissure. He has been on diltiazem ointment for the past 5 weeks. He is having less rectal bleeding. He continues to have pain during and after BM's.     Problem List/Past Medical Steven Irwin(Cammi Consalvo, MD; 10/29/2016 12:23 PM) ANAL PAIN (K62.89)  Other Problems Steven Irwin(Nerissa Constantin, MD; 10/29/2016 12:23 PM) No pertinent past medical history  Past Surgical History Steven Irwin(Jayant Kriz, MD; 10/29/2016 12:23 PM) No pertinent past surgical history  Diagnostic Studies History Steven Irwin(Kamir Selover, MD; 10/29/2016 12:23 PM) Colonoscopy never  Allergies [name], LPN; 16/10/960411/27/2017 12:09 PM) No Known Drug Allergies 09/20/2016  Medication History [name], LPN; 54/09/811911/27/2017 12:09 PM) Lidocaine (5% Ointment, 1 application External three times daily, as needed, Taken starting 10/08/2016) Active. Medications Reconciled  Social History Steven Irwin(Deniah Saia, MD; 10/29/2016 12:23 PM) Illicit drug use Uses socially only. Alcohol use Moderate alcohol use. Tobacco use Current every day smoker. No caffeine use  Family History Steven Irwin(Marquise Lambson, MD; 10/29/2016 12:23 PM) Breast Cancer Mother.     Review of Systems Steven Irwin(Nohealani Medinger MD; 10/29/2016 12:23 PM) General Not Present- Appetite Loss, Chills, Fatigue, Fever, Night Sweats, Weight Gain and Weight Loss. Skin Not Present- Change in Wart/Mole, Dryness, Hives, Jaundice, New Lesions, Non-Healing Wounds, Rash and Ulcer. HEENT Not Present- Earache, Hearing Loss, Hoarseness, Nose Bleed, Oral Ulcers, Ringing in the Ears, Seasonal Allergies, Sinus Pain, Sore Throat, Visual Disturbances, Wears glasses/contact lenses and Yellow Eyes. Respiratory Not Present- Bloody sputum, Chronic Cough,  Difficulty Breathing, Snoring and Wheezing. Breast Not Present- Breast Mass, Breast Pain, Nipple Discharge and Skin Changes. Gastrointestinal Present- Bloody Stool, Hemorrhoids and Rectal Pain. Not Present- Abdominal Pain, Bloating, Change in Bowel Habits, Chronic diarrhea, Constipation, Difficulty Swallowing, Excessive gas, Gets full quickly at meals, Indigestion, Nausea and Vomiting. Male Genitourinary Not Present- Blood in Urine, Change in Urinary Stream, Frequency, Impotence, Nocturia, Painful Urination, Urgency and Urine Leakage.  Vitals [name](Kelly Dockery LPN; 14/78/295611/27/2017 12:09 PM) 10/29/2016 12:09 PM Weight: 216.4 lb Height: 72in Body Surface Area: 2.2 m Body Mass Index: 29.35 kg/m  Temp.: 74F(Oral)  Pulse: 101 (Regular)  BP: 122/70 (Sitting, Left Arm, Standard)      Physical Exam Steven Irwin([name] MD; 10/29/2016 12:24 PM)  The physical exam findings are as follows: Note:AOx3, no distress Anicteric. EOMI, PERRL. MMM Unlabored/CTAB/RRR Abdomen soft, nontender/nondistended Ext WWP Neuro grossly intact Psych normal mood and affect Rectum: on visual inspection there is a decompressed external hemorrhoid without current thrombosis or overlying skin change on the patient's right anterior aspect. Posterior anal fissure noted. No change since last exam.    Assessment & Plan Steven Irwin([name] MD; 10/29/2016 12:22 PM)  ANAL PAIN (Principal Diagnosis) (K62.89) Impression: 35 year old male who presents to the office with rectal bleeding and pain. On exam he has a posterior anal fissure. He has been on diltiazem ointment for the past few weeks and his bleeding has gotten better but he is still having pain and on exam his fissure has not changed that much. I recommended an exam under anesthesia with chemical sphincterotomy. We discussed doing a concha mitten colonoscopy at the same time but he would like to wait until he recovers from fissure surgery and then will undergo colonoscopy  if his bleeding continues.

## 2016-10-31 ENCOUNTER — Encounter (HOSPITAL_BASED_OUTPATIENT_CLINIC_OR_DEPARTMENT_OTHER): Payer: Self-pay | Admitting: *Deleted

## 2016-10-31 NOTE — Progress Notes (Signed)
NPO AFTER MN.  ARRIVE AT 0600.  NEEDS HG.  MAY TAKE TYLENOL AM DOS W/ SIPS OF WATER IF NEEDED.

## 2016-11-01 ENCOUNTER — Encounter (HOSPITAL_BASED_OUTPATIENT_CLINIC_OR_DEPARTMENT_OTHER)
Admission: RE | Disposition: A | Discharge status: Home / Self Care | Payer: Self-pay | Source: Ambulatory Visit | Attending: General Surgery

## 2016-11-01 ENCOUNTER — Ambulatory Visit (HOSPITAL_BASED_OUTPATIENT_CLINIC_OR_DEPARTMENT_OTHER): Payer: Medicaid Other | Admitting: Anesthesiology

## 2016-11-01 ENCOUNTER — Encounter (HOSPITAL_BASED_OUTPATIENT_CLINIC_OR_DEPARTMENT_OTHER): Payer: Self-pay

## 2016-11-01 ENCOUNTER — Ambulatory Visit (HOSPITAL_BASED_OUTPATIENT_CLINIC_OR_DEPARTMENT_OTHER)
Admission: RE | Admit: 2016-11-01 | Discharge: 2016-11-01 | Disposition: A | Discharge status: Home / Self Care | Payer: Medicaid Other | Source: Ambulatory Visit | Attending: General Surgery | Admitting: General Surgery

## 2016-11-01 DIAGNOSIS — Z803 Family history of malignant neoplasm of breast: Secondary | ICD-10-CM | POA: Diagnosis not present

## 2016-11-01 DIAGNOSIS — K625 Hemorrhage of anus and rectum: Secondary | ICD-10-CM | POA: Insufficient documentation

## 2016-11-01 DIAGNOSIS — F17200 Nicotine dependence, unspecified, uncomplicated: Secondary | ICD-10-CM | POA: Insufficient documentation

## 2016-11-01 DIAGNOSIS — K601 Chronic anal fissure: Secondary | ICD-10-CM | POA: Insufficient documentation

## 2016-11-01 DIAGNOSIS — K644 Residual hemorrhoidal skin tags: Secondary | ICD-10-CM | POA: Diagnosis not present

## 2016-11-01 HISTORY — DX: Personal history of other (healed) physical injury and trauma: Z87.828

## 2016-11-01 HISTORY — PX: SPHINCTEROTOMY: SHX5279

## 2016-11-01 HISTORY — PX: RECTAL EXAM UNDER ANESTHESIA: SHX6399

## 2016-11-01 HISTORY — DX: Anal fissure, unspecified: K60.2

## 2016-11-01 HISTORY — DX: Other specified diseases of anus and rectum: K62.89

## 2016-11-01 LAB — POCT HEMOGLOBIN-HEMACUE: HEMOGLOBIN: 13.9 g/dL (ref 13.0–17.0)

## 2016-11-01 SURGERY — EXAM UNDER ANESTHESIA, RECTUM
Anesthesia: Monitor Anesthesia Care | Site: Rectum

## 2016-11-01 MED ORDER — ONABOTULINUMTOXINA 100 UNITS IJ SOLR
INTRAMUSCULAR | Status: DC | PRN
  Administered 2016-11-01: 100 [IU] via INTRAMUSCULAR

## 2016-11-01 MED ORDER — HYDROCODONE-ACETAMINOPHEN 5-325 MG PO TABS: 1.0000 | ORAL_TABLET | ORAL | 0 refills | Status: AC | PRN

## 2016-11-01 MED ORDER — SODIUM CHLORIDE 0.9 % IV SOLN
250.0000 mL | INTRAVENOUS | Status: DC | PRN
  Filled 2016-11-01: qty 250

## 2016-11-01 MED ORDER — GLYCOPYRROLATE 0.2 MG/ML IJ SOLN
INTRAMUSCULAR | Status: DC | PRN
  Administered 2016-11-01: 0.2 mg via INTRAVENOUS

## 2016-11-01 MED ORDER — SODIUM CHLORIDE 0.9% FLUSH
3.0000 mL | Freq: Two times a day (BID) | INTRAVENOUS | Status: DC
  Filled 2016-11-01: qty 3

## 2016-11-01 MED ORDER — FENTANYL CITRATE (PF) 100 MCG/2ML IJ SOLN
INTRAMUSCULAR | Status: AC
  Filled 2016-11-01: qty 2

## 2016-11-01 MED ORDER — LIDOCAINE 2% (20 MG/ML) 5 ML SYRINGE
INTRAMUSCULAR | Status: DC | PRN
  Administered 2016-11-01: 50 mg via INTRAVENOUS

## 2016-11-01 MED ORDER — SODIUM CHLORIDE 0.9 % IV SOLN
INTRAVENOUS | Status: DC | PRN
  Administered 2016-11-01: 10 ug/kg/min via INTRAVENOUS

## 2016-11-01 MED ORDER — ACETAMINOPHEN 650 MG RE SUPP
650.0000 mg | RECTAL | Status: DC | PRN
  Filled 2016-11-01: qty 1

## 2016-11-01 MED ORDER — ACETAMINOPHEN 325 MG PO TABS
650.0000 mg | ORAL_TABLET | ORAL | Status: DC | PRN
  Filled 2016-11-01: qty 2

## 2016-11-01 MED ORDER — MIDAZOLAM HCL 2 MG/2ML IJ SOLN
INTRAMUSCULAR | Status: AC
  Filled 2016-11-01: qty 2

## 2016-11-01 MED ORDER — KETAMINE HCL 10 MG/ML IJ SOLN
INTRAMUSCULAR | Status: AC
  Filled 2016-11-01: qty 1

## 2016-11-01 MED ORDER — SODIUM CHLORIDE 0.9% FLUSH
3.0000 mL | INTRAVENOUS | Status: DC | PRN
  Filled 2016-11-01: qty 3

## 2016-11-01 MED ORDER — BUPIVACAINE LIPOSOME 1.3 % IJ SUSP
INTRAMUSCULAR | Status: AC
Start: 2016-11-01 — End: 2016-11-01
  Filled 2016-11-01: qty 20

## 2016-11-01 MED ORDER — BUPIVACAINE-EPINEPHRINE 0.5% -1:200000 IJ SOLN
INTRAMUSCULAR | Status: DC | PRN
  Administered 2016-11-01: 30 mL

## 2016-11-01 MED ORDER — EPINEPHRINE PF 1 MG/ML IJ SOLN
INTRAMUSCULAR | Status: AC
  Filled 2016-11-01: qty 1

## 2016-11-01 MED ORDER — LACTATED RINGERS IV SOLN
INTRAVENOUS | Status: DC
  Administered 2016-11-01: 07:00:00 via INTRAVENOUS
  Filled 2016-11-01: qty 1000

## 2016-11-01 MED ORDER — FENTANYL CITRATE (PF) 100 MCG/2ML IJ SOLN
25.0000 ug | INTRAMUSCULAR | Status: DC | PRN
  Filled 2016-11-01: qty 1

## 2016-11-01 MED ORDER — DEXAMETHASONE SODIUM PHOSPHATE 4 MG/ML IJ SOLN
INTRAMUSCULAR | Status: DC | PRN
  Administered 2016-11-01 (×2): 5 mg via INTRAVENOUS

## 2016-11-01 MED ORDER — ACETAMINOPHEN 500 MG PO TABS: 1000.0000 mg | ORAL_TABLET | Freq: Four times a day (QID) | ORAL | 0 refills | Status: AC | PRN

## 2016-11-01 MED ORDER — MEPERIDINE HCL 25 MG/ML IJ SOLN
6.2500 mg | INTRAMUSCULAR | Status: DC | PRN
  Filled 2016-11-01: qty 1

## 2016-11-01 MED ORDER — BUPIVACAINE HCL (PF) 0.5 % IJ SOLN
INTRAMUSCULAR | Status: AC
  Filled 2016-11-01: qty 30

## 2016-11-01 MED ORDER — ONABOTULINUMTOXINA 100 UNITS IJ SOLR
INTRAMUSCULAR | Status: AC
  Filled 2016-11-01: qty 100

## 2016-11-01 MED ORDER — LACTATED RINGERS IV SOLN
INTRAVENOUS | Status: DC
  Filled 2016-11-01: qty 1000

## 2016-11-01 MED ORDER — MIDAZOLAM HCL 5 MG/5ML IJ SOLN
INTRAMUSCULAR | Status: DC | PRN
  Administered 2016-11-01: 2 mg via INTRAVENOUS

## 2016-11-01 MED ORDER — ONDANSETRON HCL 4 MG/2ML IJ SOLN
INTRAMUSCULAR | Status: AC
  Filled 2016-11-01: qty 2

## 2016-11-01 MED ORDER — OXYCODONE HCL 5 MG PO TABS
5.0000 mg | ORAL_TABLET | ORAL | Status: DC | PRN
  Filled 2016-11-01: qty 2

## 2016-11-01 MED ORDER — DEXAMETHASONE SODIUM PHOSPHATE 10 MG/ML IJ SOLN
INTRAMUSCULAR | Status: AC
  Filled 2016-11-01: qty 1

## 2016-11-01 MED ORDER — METOCLOPRAMIDE HCL 5 MG/ML IJ SOLN
10.0000 mg | Freq: Once | INTRAMUSCULAR | Status: DC | PRN
  Filled 2016-11-01: qty 2

## 2016-11-01 MED ORDER — PROPOFOL 500 MG/50ML IV EMUL
INTRAVENOUS | Status: DC | PRN
  Administered 2016-11-01: 200 ug/kg/min via INTRAVENOUS

## 2016-11-01 MED ORDER — FENTANYL CITRATE (PF) 100 MCG/2ML IJ SOLN
INTRAMUSCULAR | Status: DC | PRN
  Administered 2016-11-01: 50 ug via INTRAVENOUS

## 2016-11-01 MED ORDER — SODIUM CHLORIDE 0.9 % IJ SOLN
INTRAMUSCULAR | Status: AC
  Filled 2016-11-01: qty 50

## 2016-11-01 MED ORDER — LIDOCAINE HCL 2 % EX GEL
CUTANEOUS | Status: AC
  Filled 2016-11-01: qty 5

## 2016-11-01 MED ORDER — ONDANSETRON HCL 4 MG/2ML IJ SOLN
INTRAMUSCULAR | Status: DC | PRN
  Administered 2016-11-01: 4 mg via INTRAVENOUS

## 2016-11-01 SURGICAL SUPPLY — 57 items
BENZOIN TINCTURE PRP APPL 2/3 (GAUZE/BANDAGES/DRESSINGS) ×3 IMPLANT
BLADE HEX COATED 2.75 (ELECTRODE) ×3 IMPLANT
BLADE SURG 10 STRL SS (BLADE) ×3 IMPLANT
BLADE SURG 15 STRL LF DISP TIS (BLADE) ×1 IMPLANT
BLADE SURG 15 STRL SS (BLADE) ×2
BRIEF STRETCH FOR OB PAD LRG (UNDERPADS AND DIAPERS) ×6 IMPLANT
CANISTER SUCTION 2500CC (MISCELLANEOUS) ×3 IMPLANT
COVER BACK TABLE 60X90IN (DRAPES) ×3 IMPLANT
COVER MAYO STAND STRL (DRAPES) ×3 IMPLANT
DECANTER SPIKE VIAL GLASS SM (MISCELLANEOUS) ×3 IMPLANT
DRAIN PENROSE 18X1/2 LTX STRL (DRAIN) IMPLANT
DRAPE LAPAROTOMY 100X72 PEDS (DRAPES) ×3 IMPLANT
DRAPE LG THREE QUARTER DISP (DRAPES) ×6 IMPLANT
DRAPE UNDERBUTTOCKS STRL (DRAPE) IMPLANT
DRAPE UTILITY XL STRL (DRAPES) ×3 IMPLANT
ELECT BLADE 6.5 .24CM SHAFT (ELECTRODE) IMPLANT
ELECT REM PT RETURN 9FT ADLT (ELECTROSURGICAL) ×3
ELECTRODE REM PT RTRN 9FT ADLT (ELECTROSURGICAL) ×1 IMPLANT
GAUZE SPONGE 4X4 12PLY STRL (GAUZE/BANDAGES/DRESSINGS) ×3 IMPLANT
GAUZE SPONGE 4X4 16PLY XRAY LF (GAUZE/BANDAGES/DRESSINGS) ×3 IMPLANT
GAUZE VASELINE 3X9 (GAUZE/BANDAGES/DRESSINGS) IMPLANT
GLOVE BIO SURGEON STRL SZ 6.5 (GLOVE) ×4 IMPLANT
GLOVE BIO SURGEONS STRL SZ 6.5 (GLOVE) ×2
GLOVE INDICATOR 7.0 STRL GRN (GLOVE) ×6 IMPLANT
GOWN STRL REUS W/ TWL LRG LVL3 (GOWN DISPOSABLE) ×1 IMPLANT
GOWN STRL REUS W/ TWL XL LVL3 (GOWN DISPOSABLE) ×2 IMPLANT
GOWN STRL REUS W/TWL 2XL LVL3 (GOWN DISPOSABLE) ×3 IMPLANT
GOWN STRL REUS W/TWL LRG LVL3 (GOWN DISPOSABLE) ×2
GOWN STRL REUS W/TWL XL LVL3 (GOWN DISPOSABLE) ×4
KIT ROOM TURNOVER WOR (KITS) ×3 IMPLANT
LEGGING LITHOTOMY PAIR STRL (DRAPES) IMPLANT
LOOP VESSEL MAXI BLUE (MISCELLANEOUS) IMPLANT
NDL SAFETY ECLIPSE 18X1.5 (NEEDLE) ×1 IMPLANT
NEEDLE HYPO 18GX1.5 SHARP (NEEDLE) ×2
NEEDLE HYPO 22GX1.5 SAFETY (NEEDLE) ×3 IMPLANT
NS IRRIG 500ML POUR BTL (IV SOLUTION) ×3 IMPLANT
PACK BASIN DAY SURGERY FS (CUSTOM PROCEDURE TRAY) ×3 IMPLANT
PAD ABD 8X10 STRL (GAUZE/BANDAGES/DRESSINGS) ×3 IMPLANT
PAD ARMBOARD 7.5X6 YLW CONV (MISCELLANEOUS) IMPLANT
PENCIL BUTTON HOLSTER BLD 10FT (ELECTRODE) ×3 IMPLANT
SPONGE GAUZE 4X4 12PLY (GAUZE/BANDAGES/DRESSINGS) IMPLANT
SPONGE SURGIFOAM ABS GEL 100 (HEMOSTASIS) ×3 IMPLANT
SPONGE SURGIFOAM ABS GEL 12-7 (HEMOSTASIS) IMPLANT
SUT CHROMIC 2 0 SH (SUTURE) IMPLANT
SUT CHROMIC 3 0 SH 27 (SUTURE) IMPLANT
SUT ETHIBOND 0 (SUTURE) IMPLANT
SUT GUT CHROMIC 3 0 (SUTURE) IMPLANT
SUT VIC AB 4-0 P-3 18XBRD (SUTURE) IMPLANT
SUT VIC AB 4-0 P3 18 (SUTURE)
SYR CONTROL 10ML LL (SYRINGE) ×3 IMPLANT
TOWEL OR 17X24 6PK STRL BLUE (TOWEL DISPOSABLE) ×3 IMPLANT
TRAY DSU PREP LF (CUSTOM PROCEDURE TRAY) ×3 IMPLANT
TUBE CONNECTING 12'X1/4 (SUCTIONS) ×1
TUBE CONNECTING 12X1/4 (SUCTIONS) ×2 IMPLANT
UNDERPAD 30X30 INCONTINENT (UNDERPADS AND DIAPERS) ×3 IMPLANT
WATER STERILE IRR 500ML POUR (IV SOLUTION) ×3 IMPLANT
YANKAUER SUCT BULB TIP NO VENT (SUCTIONS) ×3 IMPLANT

## 2016-11-01 NOTE — Anesthesia Postprocedure Evaluation (Signed)
Anesthesia Post Note  Patient: Mosie EpsteinChristopher R Mells  Procedure(s) Performed: Procedure(s) (LRB): RECTAL EXAM UNDER ANESTHESIA (N/A) CHEMICAL SPHINCTEROTOMY (N/A)  Patient location during evaluation: PACU Anesthesia Type: MAC Level of consciousness: awake and alert Pain management: pain level controlled Vital Signs Assessment: post-procedure vital signs reviewed and stable Respiratory status: spontaneous breathing, nonlabored ventilation, respiratory function stable and patient connected to nasal cannula oxygen Cardiovascular status: stable and blood pressure returned to baseline Anesthetic complications: no    Last Vitals:  Vitals:   11/01/16 0900 11/01/16 0931  BP:  138/89  Pulse: 98   Resp: 17 16  Temp:  36.8 C    Last Pain:  Vitals:   11/01/16 0931  TempSrc: Tympanic  PainSc:                  Phillips Groutarignan, Thane Age

## 2016-11-01 NOTE — Anesthesia Preprocedure Evaluation (Signed)
Anesthesia Evaluation  Patient identified by MRN, date of birth, ID band Patient awake    Reviewed: Allergy & Precautions, NPO status , Patient's Chart, lab work & pertinent test results  Airway Mallampati: II  TM Distance: >3 FB Neck ROM: Full    Dental no notable dental hx.    Pulmonary Current Smoker,    Pulmonary exam normal breath sounds clear to auscultation       Cardiovascular negative cardio ROS Normal cardiovascular exam Rhythm:Regular Rate:Normal     Neuro/Psych negative neurological ROS  negative psych ROS   GI/Hepatic negative GI ROS, Neg liver ROS,   Endo/Other  negative endocrine ROS  Renal/GU negative Renal ROS  negative genitourinary   Musculoskeletal negative musculoskeletal ROS (+)   Abdominal   Peds negative pediatric ROS (+)  Hematology negative hematology ROS (+)   Anesthesia Other Findings   Reproductive/Obstetrics negative OB ROS                             Anesthesia Physical Anesthesia Plan  ASA: II  Anesthesia Plan: MAC   Post-op Pain Management:    Induction:   Airway Management Planned: Simple Face Mask  Additional Equipment:   Intra-op Plan:   Post-operative Plan:   Informed Consent: I have reviewed the patients History and Physical, chart, labs and discussed the procedure including the risks, benefits and alternatives for the proposed anesthesia with the patient or authorized representative who has indicated his/her understanding and acceptance.   Dental advisory given  Plan Discussed with: CRNA  Anesthesia Plan Comments:         Anesthesia Quick Evaluation  

## 2016-11-01 NOTE — Interval H&P Note (Signed)
History and Physical Interval Note:  11/01/2016 7:03 AM  Steven Irwin  has presented today for surgery, with the diagnosis of anal fissure  The various methods of treatment have been discussed with the patient and family. After consideration of risks, benefits and other options for treatment, the patient has consented to  Procedure(s): RECTAL EXAM UNDER ANESTHESIA (N/A) CHEMICAL SPHINCTEROTOMY (N/A) Rigid Proctoscopy as a surgical intervention .  The patient's history has been reviewed, patient examined, no change in status, stable for surgery.  I have reviewed the patient's chart and labs.  Questions were answered to the patient's satisfaction.     Vanita PandaAlicia C Zackeriah Kissler, MD  Colorectal and General Surgery [facility]

## 2016-11-01 NOTE — Discharge Instructions (Addendum)

## 2016-11-01 NOTE — Op Note (Signed)
11/01/2016  7:59 AM  PATIENT:  Steven Irwin  35 y.o. male  Patient Care Team: Provider Not In System as PCP - General  PRE-OPERATIVE DIAGNOSIS:  anal fissure  POST-OPERATIVE DIAGNOSIS:  Anal fissure  PROCEDURE:   CHEMICAL SPHINCTEROTOMY, ANAL EXAM UNDER ANESTHESIA, RIGID PROCTOSCOPY   Surgeon(s): Romie LeveeAlicia Kolten Ryback, MD  ASSISTANT: none   ANESTHESIA:   local and MAC  SPECIMEN:  No Specimen  DISPOSITION OF SPECIMEN:  N/A  COUNTS:  YES  PLAN OF CARE: Discharge to home after PACU  PATIENT DISPOSITION:  PACU - hemodynamically stable.  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 He has failed medical treatment with diltiazem ointment. I recommended chemical sphincterotomy. Due to his profuse rectal bleeding I recommended a rigid proctoscopy to confirm no other source in the distal large bowel.   OR FINDINGS: Significant posterior midline anal fissure with exposed sphincter.  DESCRIPTION: the patient was identified in the preoperative holding area and taken to the OR where they were laid on the operating room table.  MAC anesthesia was induced without difficulty. The patient was then positioned in prone jackknife position with buttocks gently taped apart.  The patient was then prepped and draped in usual sterile fashion.  SCDs were noted to be in place prior to the initiation of anesthesia. A surgical timeout was performed indicating the correct patient, procedure, positioning and need for preoperative antibiotics.  A rectal block was performed using Marcaine with epinephrine.    I began with a digital rectal exam.  No masses could be palpated.  I then placed a Hill-Ferguson anoscope into the anal canal and evaluated this completely.  There was a large posterior anal fissure with exposed sphincter muscle underneath. I think this is the most obvious cause of his bleeding. The edges appeared scarred and I decided to resect these with Metzenbaum scissors to help the  area heal. Hemostasis was achieved after this using direct pressure.  I then placed 100 units of Botox into the intersphincteric groove to allow for relaxation of the internal spector. Finally, I inserted the rigid proctoscope and advanced to approximately 20 cm. The distal sigmoid and rectum were well visualized. There was no obvious tumor or mass noted.  Lidocaine ointment and a dressing were applied. The patient was awakened from anesthesia and sent to the postanesthesia care unit in stable condition. All counts were correct per operating room staff.

## 2016-11-01 NOTE — Anesthesia Procedure Notes (Signed)
Procedure Name: MAC Date/Time: 11/01/2016 7:32 AM Performed by: Tyrone NineSAUVE, Kohl Polinsky F Pre-anesthesia Checklist: Patient identified, Timeout performed, Emergency Drugs available, Suction available and Patient being monitored Patient Re-evaluated:Patient Re-evaluated prior to inductionOxygen Delivery Method: Nasal cannula Intubation Type: IV induction Placement Confirmation: CO2 detector Dental Injury: Teeth and Oropharynx as per pre-operative assessment

## 2016-11-01 NOTE — H&P (View-Only) (Signed)
  History of Present Illness (Money Mckeithan MD; 10/29/2016 12:23 PM) The patient is a 35 year old male who presents with anal pain. 35-year-old male who presents to the office for evaluation of rectal bleeding. He has been seen by Dr. Conner in our office and was felt to have an anal fissure. He has been on diltiazem ointment for the past 5 weeks. He is having less rectal bleeding. He continues to have pain during and after BM's.     Problem List/Past Medical (Effrey Davidow, MD; 10/29/2016 12:23 PM) ANAL PAIN (K62.89)  Other Problems (Hiran Leard, MD; 10/29/2016 12:23 PM) No pertinent past medical history  Past Surgical History (Marlina Cataldi, MD; 10/29/2016 12:23 PM) No pertinent past surgical history  Diagnostic Studies History (Amyrie Illingworth, MD; 10/29/2016 12:23 PM) Colonoscopy never  Allergies (Kelly Dockery, LPN; 10/29/2016 12:09 PM) No Known Drug Allergies 09/20/2016  Medication History (Kelly Dockery, LPN; 10/29/2016 12:09 PM) Lidocaine (5% Ointment, 1 application External three times daily, as needed, Taken starting 10/08/2016) Active. Medications Reconciled  Social History (Zakayla Martinec, MD; 10/29/2016 12:23 PM) Illicit drug use Uses socially only. Alcohol use Moderate alcohol use. Tobacco use Current every day smoker. No caffeine use  Family History (Arlean Thies, MD; 10/29/2016 12:23 PM) Breast Cancer Mother.     Review of Systems (Shakura Cowing MD; 10/29/2016 12:23 PM) General Not Present- Appetite Loss, Chills, Fatigue, Fever, Night Sweats, Weight Gain and Weight Loss. Skin Not Present- Change in Wart/Mole, Dryness, Hives, Jaundice, New Lesions, Non-Healing Wounds, Rash and Ulcer. HEENT Not Present- Earache, Hearing Loss, Hoarseness, Nose Bleed, Oral Ulcers, Ringing in the Ears, Seasonal Allergies, Sinus Pain, Sore Throat, Visual Disturbances, Wears glasses/contact lenses and Yellow Eyes. Respiratory Not Present- Bloody sputum, Chronic Cough,  Difficulty Breathing, Snoring and Wheezing. Breast Not Present- Breast Mass, Breast Pain, Nipple Discharge and Skin Changes. Gastrointestinal Present- Bloody Stool, Hemorrhoids and Rectal Pain. Not Present- Abdominal Pain, Bloating, Change in Bowel Habits, Chronic diarrhea, Constipation, Difficulty Swallowing, Excessive gas, Gets full quickly at meals, Indigestion, Nausea and Vomiting. Male Genitourinary Not Present- Blood in Urine, Change in Urinary Stream, Frequency, Impotence, Nocturia, Painful Urination, Urgency and Urine Leakage.  Vitals (Kelly Dockery LPN; 10/29/2016 12:09 PM) 10/29/2016 12:09 PM Weight: 216.4 lb Height: 72in Body Surface Area: 2.2 m Body Mass Index: 29.35 kg/m  Temp.: 98F(Oral)  Pulse: 101 (Regular)  BP: 122/70 (Sitting, Left Arm, Standard)      Physical Exam (Gertude Benito MD; 10/29/2016 12:24 PM)  The physical exam findings are as follows: Note:AOx3, no distress Anicteric. EOMI, PERRL. MMM Unlabored/CTAB/RRR Abdomen soft, nontender/nondistended Ext WWP Neuro grossly intact Psych normal mood and affect Rectum: on visual inspection there is a decompressed external hemorrhoid without current thrombosis or overlying skin change on the patient's right anterior aspect. Posterior anal fissure noted. No change since last exam.    Assessment & Plan (Giles Currie MD; 10/29/2016 12:22 PM)  ANAL PAIN (Principal Diagnosis) (K62.89) Impression: 35-year-old male who presents to the office with rectal bleeding and pain. On exam he has a posterior anal fissure. He has been on diltiazem ointment for the past few weeks and his bleeding has gotten better but he is still having pain and on exam his fissure has not changed that much. I recommended an exam under anesthesia with chemical sphincterotomy. We discussed doing a concha mitten colonoscopy at the same time but he would like to wait until he recovers from fissure surgery and then will undergo colonoscopy  if his bleeding continues. 

## 2016-11-01 NOTE — Transfer of Care (Signed)
Immediate Anesthesia Transfer of Care Note  Patient: Steven Irwin  Procedure(s) Performed: Procedure(s): RECTAL EXAM UNDER ANESTHESIA (N/A) CHEMICAL SPHINCTEROTOMY (N/A)  Patient Location: PACU  Anesthesia Type:MAC  Level of Consciousness: awake, alert , oriented and patient cooperative  Airway & Oxygen Therapy: Patient Spontanous Breathing and Patient connected to nasal cannula oxygen  Post-op Assessment: Report given to RN and Post -op Vital signs reviewed and stable  Post vital signs: Reviewed and stable  Last Vitals:  Vitals:   11/01/16 0609  BP: 137/86  Pulse: 92  Resp: 16  Temp: 36.9 C    Last Pain:  Vitals:   11/01/16 0644  TempSrc:   PainSc: 8       Patients Stated Pain Goal: 5 (11/01/16 0644)  Complications: No apparent anesthesia complications

## 2016-11-02 ENCOUNTER — Encounter (HOSPITAL_BASED_OUTPATIENT_CLINIC_OR_DEPARTMENT_OTHER): Payer: Self-pay | Admitting: General Surgery

## 2018-08-27 ENCOUNTER — Ambulatory Visit (INDEPENDENT_AMBULATORY_CARE_PROVIDER_SITE_OTHER): Payer: Self-pay

## 2018-08-27 ENCOUNTER — Ambulatory Visit (HOSPITAL_COMMUNITY)
Admission: EM | Admit: 2018-08-27 | Discharge: 2018-08-27 | Disposition: A | Discharge status: Home / Self Care | Payer: Self-pay | Attending: Family Medicine | Admitting: Family Medicine

## 2018-08-27 ENCOUNTER — Encounter (HOSPITAL_COMMUNITY): Payer: Self-pay | Admitting: Emergency Medicine

## 2018-08-27 DIAGNOSIS — M25562 Pain in left knee: Secondary | ICD-10-CM

## 2018-08-27 MED ORDER — NAPROXEN 500 MG PO TABS: 500.0000 mg | ORAL_TABLET | Freq: Two times a day (BID) | ORAL | 0 refills | Status: AC

## 2018-08-27 NOTE — ED Notes (Signed)
Provider discharge.

## 2018-08-27 NOTE — ED Notes (Signed)
Bed: UC07 Expected date:  Expected time:  Means of arrival:  Comments: 
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
## 2020-04-23 ENCOUNTER — Emergency Department (HOSPITAL_COMMUNITY)
Admission: EM | Admit: 2020-04-23 | Discharge: 2020-04-23 | Disposition: A | Discharge status: Home / Self Care | Payer: Medicaid Other | Attending: Emergency Medicine | Admitting: Emergency Medicine

## 2020-04-23 ENCOUNTER — Other Ambulatory Visit: Payer: Self-pay

## 2020-04-23 DIAGNOSIS — K648 Other hemorrhoids: Secondary | ICD-10-CM | POA: Insufficient documentation

## 2020-04-23 DIAGNOSIS — Z79899 Other long term (current) drug therapy: Secondary | ICD-10-CM | POA: Insufficient documentation

## 2020-04-23 DIAGNOSIS — F17210 Nicotine dependence, cigarettes, uncomplicated: Secondary | ICD-10-CM | POA: Insufficient documentation

## 2020-04-23 DIAGNOSIS — Y902 Blood alcohol level of 40-59 mg/100 ml: Secondary | ICD-10-CM | POA: Insufficient documentation

## 2020-04-23 DIAGNOSIS — F10920 Alcohol use, unspecified with intoxication, uncomplicated: Secondary | ICD-10-CM | POA: Insufficient documentation

## 2020-04-23 LAB — CBC
HCT: 36.5 % — ABNORMAL LOW (ref 39.0–52.0)
Hemoglobin: 12.5 g/dL — ABNORMAL LOW (ref 13.0–17.0)
MCH: 32.2 pg (ref 26.0–34.0)
MCHC: 34.2 g/dL (ref 30.0–36.0)
MCV: 94.1 fL (ref 80.0–100.0)
Platelets: 318 10*3/uL (ref 150–400)
RBC: 3.88 MIL/uL — ABNORMAL LOW (ref 4.22–5.81)
RDW: 12.6 % (ref 11.5–15.5)
WBC: 8.6 10*3/uL (ref 4.0–10.5)
nRBC: 0 % (ref 0.0–0.2)

## 2020-04-23 LAB — COMPREHENSIVE METABOLIC PANEL
ALT: 76 U/L — ABNORMAL HIGH (ref 0–44)
AST: 65 U/L — ABNORMAL HIGH (ref 15–41)
Albumin: 4 g/dL (ref 3.5–5.0)
Alkaline Phosphatase: 80 U/L (ref 38–126)
Anion gap: 14 (ref 5–15)
BUN: 13 mg/dL (ref 6–20)
CO2: 24 mmol/L (ref 22–32)
Calcium: 8.8 mg/dL — ABNORMAL LOW (ref 8.9–10.3)
Chloride: 103 mmol/L (ref 98–111)
Creatinine, Ser: 0.95 mg/dL (ref 0.61–1.24)
GFR calc Af Amer: >60 mL/min (ref >60)
GFR calc non Af Amer: >60 mL/min (ref >60)
Glucose, Bld: 134 mg/dL — ABNORMAL HIGH (ref 70–99)
Potassium: 3.4 mmol/L — ABNORMAL LOW (ref 3.5–5.1)
Sodium: 141 mmol/L (ref 135–145)
Total Bilirubin: 0.8 mg/dL (ref 0.3–1.2)
Total Protein: 7 g/dL (ref 6.5–8.1)

## 2020-04-23 LAB — RAPID URINE DRUG SCREEN, HOSP PERFORMED
Amphetamines: POSITIVE — AB
Barbiturates: NOT DETECTED
Benzodiazepines: POSITIVE — AB
Cocaine: POSITIVE — AB
Opiates: NOT DETECTED
Tetrahydrocannabinol: NOT DETECTED

## 2020-04-23 LAB — ETHANOL: Alcohol, Ethyl (B): 53 mg/dL — ABNORMAL HIGH (ref <10)

## 2020-04-23 MED ORDER — POLYETHYLENE GLYCOL 3350 17 G PO PACK: 17.0000 g | PACK | Freq: Every day | ORAL | 1 refills | Status: AC

## 2020-04-23 MED ORDER — LIDOCAINE-HYDROCORT (PERIANAL) 3-0.5 % EX CREA: TOPICAL_CREAM | CUTANEOUS | 0 refills | Status: AC
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
## 2020-04-23 NOTE — Discharge Instructions (Addendum)
You were evaluated in the Emergency Department and after careful evaluation, we did not find any emergent condition requiring admission or further testing in the hospital.  Your exam/testing today was overall reassuring.  Your symptoms seem to be due to hemorrhoids.  The main treatment of hemorrhoids is to avoid or treat constipation.  Please use the MiraLAX as needed up to 4 packets/day to achieve soft stool.  You can use the cream provided as needed for discomfort.  We also recommend that you obtain a sitz bath from the pharmacy or sit in warm soapy water as often as you can during the day.  If you are interested in surgical options for management, you can call the surgery office number provided.  Please return to the Emergency Department if you experience any worsening of your condition.  We encourage you to follow up with a primary care provider.  Thank you for allowing [place] to be a part of your care.
